# Patient Record
Sex: Male | Born: 1960 | Race: Black or African American | Hispanic: No | Marital: Single | Smoking: Former smoker
Health system: Southern US, Community
[De-identification: ages and names within clinical notes are randomized; demographics above are authoritative.]

## PROBLEM LIST (undated history)

## (undated) DIAGNOSIS — F1010 Alcohol abuse, uncomplicated: Secondary | ICD-10-CM

---

## 2012-09-10 ENCOUNTER — Emergency Department (INDEPENDENT_AMBULATORY_CARE_PROVIDER_SITE_OTHER)
Admission: EM | Admit: 2012-09-10 | Discharge: 2012-09-10 | Disposition: A | Discharge status: Home / Self Care | Payer: Self-pay | Source: Home / Self Care | Attending: Emergency Medicine | Admitting: Emergency Medicine

## 2012-09-10 ENCOUNTER — Encounter (HOSPITAL_COMMUNITY): Payer: Self-pay | Admitting: *Deleted

## 2012-09-10 DIAGNOSIS — R200 Anesthesia of skin: Secondary | ICD-10-CM

## 2012-09-10 DIAGNOSIS — R209 Unspecified disturbances of skin sensation: Secondary | ICD-10-CM

## 2012-09-10 LAB — POCT URINALYSIS DIP (DEVICE)
Protein, ur: NEGATIVE mg/dL
Specific Gravity, Urine: <=1.005 (ref 1.005–1.030)
Urobilinogen, UA: 0.2 mg/dL (ref 0.0–1.0)

## 2012-09-10 LAB — POCT I-STAT, CHEM 8
Creatinine, Ser: 1.5 mg/dL — ABNORMAL HIGH (ref 0.50–1.35)
Glucose, Bld: 97 mg/dL (ref 70–99)
Hemoglobin: 15.3 g/dL (ref 13.0–17.0)
Sodium: 140 mEq/L (ref 135–145)
TCO2: 25 mmol/L (ref 0–100)

## 2012-09-10 MED ORDER — SODIUM CHLORIDE 0.9 % IV SOLN
INTRAVENOUS | Status: DC
  Administered 2012-09-10: 17:00:00 via INTRAVENOUS

## 2012-09-10 NOTE — ED Provider Notes (Signed)
History     CSN: [id number]  Arrival date & time 09/10/12  1613   None     Chief Complaint  Patient presents with  . Numbness    (Consider location/radiation/quality/duration/timing/severity/associated sxs/prior treatment) Patient is a 52 y.o. male presenting with lower extremity pain. The history is provided by the patient. No language interpreter was used.  Foot Pain This is a new problem. Episode onset: 3-4 weeks. The problem occurs constantly. The problem has been gradually worsening. He has tried nothing for the symptoms.   Pt complains of toes being numb on his left foot.   Pt is worried about diabetes.  Pt reports he does not have a doctor.  No medical care.  Pt has a history of alcohol abuse.  Pt complains of urinating frequently. History reviewed. No pertinent past medical history.  History reviewed. No pertinent past surgical history.  Family History  Problem Relation Age of Onset  . Family history unknown: Yes    History  Substance Use Topics  . Smoking status: Never Smoker   . Smokeless tobacco: Not on file  . Alcohol Use: 2.4 oz/week    4 Cans of beer per week     pt drinks daily      Review of Systems  All other systems reviewed and are negative.    Allergies  Review of patient's allergies indicates no known allergies.  Home Medications  No current outpatient prescriptions on file.  BP 110/75  Pulse 92  Temp 97.9 F (36.6 C) (Oral)  Resp 18  SpO2 95%  Physical Exam  Nursing note and vitals reviewed. Constitutional: He appears well-developed and well-nourished.  HENT:  Head: Normocephalic and atraumatic.  Right Ear: External ear normal.  Left Ear: External ear normal.  Nose: Nose normal.  Mouth/Throat: Oropharynx is clear and moist.  Eyes: Conjunctivae normal are normal.  Neck: Normal range of motion.  Cardiovascular: Normal rate and normal heart sounds.   Pulmonary/Chest: Effort normal.  Abdominal: Soft.  Musculoskeletal:   Decreased sensation left toes,  Pt unable to flex and extend foot,  Pt denies any injuries  Neurological: He is alert.  Skin: Skin is warm.  Psychiatric: He has a normal mood and affect.    ED Course  Procedures (including critical care time)  Labs Reviewed  POCT URINALYSIS DIP (DEVICE) - Abnormal; Notable for the following:    Hgb urine dipstick SMALL (*)     All other components within normal limits   No results found.   1. Numbness of foot       MDM  Pt reports he has had back pain in the past.  Pt reports chronic pain pain.   I suspect pt has a herniated disc.  I am concerned because of foot drop.   Pt to ED.   (Pt decided not to go to ED after agreeing to go after discussing symptoms with me,  He did not inform me but refused shuttle and left.          Lonia Skinner [place], [place] 09/10/12 1844  Elson Areas, PA 09/10/12 1845  Lonia Skinner [place], [place] 09/10/12 1918

## 2012-09-10 NOTE — ED Notes (Signed)
Pt reports left toe numbness - "It has been happening for awhile, I am an alcoholic. I think that I could have diabetes" pt speech is slurred, slightly unsteady on feet

## 2012-09-10 NOTE — ED Notes (Signed)
Pt aunt reports that pt refuses to go to the er - advised of it's importance and risks of not going. MD aware

## 2012-09-15 NOTE — ED Provider Notes (Signed)
Medical screening examination/treatment/procedure(s) were performed by non-physician practitioner and as supervising physician I was immediately available for consultation/collaboration.  Kenyetta Wimbish M. MD   Ketrick Matney M Jermiah Soderman, MD 09/15/12 0806 

## 2013-07-28 ENCOUNTER — Inpatient Hospital Stay (HOSPITAL_COMMUNITY)
Admission: AD | Admit: 2013-07-28 | Discharge: 2013-08-01 | DRG: 897 | Disposition: A | Discharge status: Home / Self Care | Payer: No Typology Code available for payment source | Source: Intra-hospital | Attending: Psychiatry | Admitting: Psychiatry

## 2013-07-28 ENCOUNTER — Encounter (HOSPITAL_COMMUNITY): Payer: Self-pay

## 2013-07-28 ENCOUNTER — Encounter (HOSPITAL_COMMUNITY): Payer: Self-pay | Admitting: *Deleted

## 2013-07-28 ENCOUNTER — Emergency Department (HOSPITAL_COMMUNITY)
Admission: EM | Admit: 2013-07-28 | Discharge: 2013-07-28 | Disposition: A | Discharge status: Other Acute Inpatient Hospital | Payer: Self-pay | Attending: Emergency Medicine | Admitting: Emergency Medicine

## 2013-07-28 DIAGNOSIS — F1010 Alcohol abuse, uncomplicated: Secondary | ICD-10-CM

## 2013-07-28 DIAGNOSIS — F10129 Alcohol abuse with intoxication, unspecified: Secondary | ICD-10-CM | POA: Diagnosis present

## 2013-07-28 DIAGNOSIS — F1020 Alcohol dependence, uncomplicated: Principal | ICD-10-CM | POA: Diagnosis present

## 2013-07-28 DIAGNOSIS — R259 Unspecified abnormal involuntary movements: Secondary | ICD-10-CM | POA: Insufficient documentation

## 2013-07-28 DIAGNOSIS — F10239 Alcohol dependence with withdrawal, unspecified: Secondary | ICD-10-CM

## 2013-07-28 HISTORY — DX: Alcohol abuse, uncomplicated: F10.10

## 2013-07-28 LAB — RAPID URINE DRUG SCREEN, HOSP PERFORMED
Barbiturates: NOT DETECTED
Benzodiazepines: NOT DETECTED
Cocaine: NOT DETECTED
Tetrahydrocannabinol: NOT DETECTED

## 2013-07-28 LAB — CBC
MCH: 31.4 pg (ref 26.0–34.0)
MCHC: 34.1 g/dL (ref 30.0–36.0)
Platelets: 257 10*3/uL (ref 150–400)

## 2013-07-28 LAB — COMPREHENSIVE METABOLIC PANEL
ALT: 135 U/L — ABNORMAL HIGH (ref 0–53)
AST: 177 U/L — ABNORMAL HIGH (ref 0–37)
Calcium: 9.8 mg/dL (ref 8.4–10.5)
Sodium: 137 mEq/L (ref 135–145)
Total Protein: 8.5 g/dL — ABNORMAL HIGH (ref 6.0–8.3)

## 2013-07-28 MED ORDER — ONDANSETRON HCL 4 MG PO TABS: 8.0000 mg | ORAL_TABLET | Freq: Three times a day (TID) | ORAL | Status: DC | PRN

## 2013-07-28 MED ORDER — MAGNESIUM HYDROXIDE 400 MG/5ML PO SUSP: 30.0000 mL | Freq: Every day | ORAL | Status: DC | PRN

## 2013-07-28 MED ORDER — CHLORDIAZEPOXIDE HCL 25 MG PO CAPS
25.0000 mg | ORAL_CAPSULE | Freq: Four times a day (QID) | ORAL | Status: AC
  Administered 2013-07-29 – 2013-07-30 (×6): 25 mg via ORAL
  Filled 2013-07-28 (×6): qty 1

## 2013-07-28 MED ORDER — CHLORDIAZEPOXIDE HCL 25 MG PO CAPS
25.0000 mg | ORAL_CAPSULE | ORAL | Status: AC
  Administered 2013-07-31 – 2013-08-01 (×2): 25 mg via ORAL
  Filled 2013-07-28 (×2): qty 1

## 2013-07-28 MED ORDER — ZOLPIDEM TARTRATE 5 MG PO TABS: 5.0000 mg | ORAL_TABLET | Freq: Every evening | ORAL | Status: DC | PRN

## 2013-07-28 MED ORDER — CHLORDIAZEPOXIDE HCL 25 MG PO CAPS
25.0000 mg | ORAL_CAPSULE | Freq: Every day | ORAL | Status: AC
  Administered 2013-08-01: 25 mg via ORAL

## 2013-07-28 MED ORDER — THIAMINE HCL 100 MG/ML IJ SOLN: 100.0000 mg | Freq: Every day | INTRAMUSCULAR | Status: DC

## 2013-07-28 MED ORDER — HYDROXYZINE HCL 25 MG PO TABS: 25.0000 mg | ORAL_TABLET | Freq: Four times a day (QID) | ORAL | Status: AC | PRN

## 2013-07-28 MED ORDER — ACETAMINOPHEN 325 MG PO TABS: 650.0000 mg | ORAL_TABLET | ORAL | Status: DC | PRN

## 2013-07-28 MED ORDER — ADULT MULTIVITAMIN W/MINERALS CH
1.0000 | ORAL_TABLET | Freq: Every day | ORAL | Status: DC
  Administered 2013-07-29 – 2013-08-01 (×4): 1 via ORAL
  Filled 2013-07-28 (×6): qty 1

## 2013-07-28 MED ORDER — ALUM & MAG HYDROXIDE-SIMETH 200-200-20 MG/5ML PO SUSP: 30.0000 mL | ORAL | Status: DC | PRN

## 2013-07-28 MED ORDER — TRAZODONE HCL 50 MG PO TABS
50.0000 mg | ORAL_TABLET | Freq: Every evening | ORAL | Status: DC | PRN
  Filled 2013-07-28: qty 28
  Filled 2013-07-28: qty 1
  Filled 2013-07-28: qty 28
  Filled 2013-07-28 (×8): qty 1
  Filled 2013-07-28: qty 28
  Filled 2013-07-28 (×2): qty 1
  Filled 2013-07-28: qty 28
  Filled 2013-07-28: qty 1

## 2013-07-28 MED ORDER — ACETAMINOPHEN 325 MG PO TABS: 650.0000 mg | ORAL_TABLET | Freq: Four times a day (QID) | ORAL | Status: DC | PRN

## 2013-07-28 MED ORDER — LOPERAMIDE HCL 2 MG PO CAPS: 2.0000 mg | ORAL_CAPSULE | ORAL | Status: AC | PRN

## 2013-07-28 MED ORDER — VITAMIN B-1 100 MG PO TABS
100.0000 mg | ORAL_TABLET | Freq: Every day | ORAL | Status: DC
  Administered 2013-07-29 – 2013-08-01 (×4): 100 mg via ORAL
  Filled 2013-07-28 (×6): qty 1

## 2013-07-28 MED ORDER — LORAZEPAM 1 MG PO TABS
0.0000 mg | ORAL_TABLET | Freq: Four times a day (QID) | ORAL | Status: DC
  Administered 2013-07-28: 1 mg via ORAL
  Filled 2013-07-28: qty 1

## 2013-07-28 MED ORDER — CHLORDIAZEPOXIDE HCL 25 MG PO CAPS
25.0000 mg | ORAL_CAPSULE | Freq: Three times a day (TID) | ORAL | Status: AC
  Administered 2013-07-30 – 2013-07-31 (×3): 25 mg via ORAL
  Filled 2013-07-28 (×3): qty 1

## 2013-07-28 MED ORDER — LORAZEPAM 1 MG PO TABS: 0.0000 mg | ORAL_TABLET | Freq: Two times a day (BID) | ORAL | Status: DC

## 2013-07-28 MED ORDER — CHLORDIAZEPOXIDE HCL 25 MG PO CAPS: 25.0000 mg | ORAL_CAPSULE | Freq: Four times a day (QID) | ORAL | Status: AC | PRN

## 2013-07-28 MED ORDER — THIAMINE HCL 100 MG/ML IJ SOLN: 100.0000 mg | Freq: Once | INTRAMUSCULAR | Status: DC

## 2013-07-28 MED ORDER — ONDANSETRON 4 MG PO TBDP: 4.0000 mg | ORAL_TABLET | Freq: Four times a day (QID) | ORAL | Status: AC | PRN

## 2013-07-28 MED ORDER — VITAMIN B-1 100 MG PO TABS
100.0000 mg | ORAL_TABLET | Freq: Every day | ORAL | Status: DC
  Administered 2013-07-28: 100 mg via ORAL
  Filled 2013-07-28: qty 1

## 2013-07-28 MED ORDER — IBUPROFEN 200 MG PO TABS: 600.0000 mg | ORAL_TABLET | Freq: Three times a day (TID) | ORAL | Status: DC | PRN

## 2013-07-28 MED ORDER — CHLORDIAZEPOXIDE HCL 25 MG PO CAPS
25.0000 mg | ORAL_CAPSULE | Freq: Once | ORAL | Status: AC
  Administered 2013-07-28: 25 mg via ORAL
  Filled 2013-07-28: qty 1

## 2013-07-28 MED ORDER — NICOTINE 21 MG/24HR TD PT24: 21.0000 mg | MEDICATED_PATCH | Freq: Every day | TRANSDERMAL | Status: DC

## 2013-07-28 NOTE — Progress Notes (Signed)
[facility] provided patient with a [facility] Orange Card application.  °

## 2013-07-28 NOTE — Tx Team (Signed)
Initial Interdisciplinary Treatment Plan  PATIENT STRENGTHS: (choose at least two) Ability for insight Average or above average intelligence Capable of independent living General fund of knowledge Motivation for treatment/growth Physical Health Supportive family/friends  PATIENT STRESSORS: Financial difficulties Substance abuse   PROBLEM LIST: Problem List/Patient Goals Date to be addressed Date deferred Reason deferred Estimated date of resolution  ETOH abuse 07/29/13                                                      DISCHARGE CRITERIA:  Improved stabilization in mood, thinking, and/or behavior Motivation to continue treatment in a less acute level of care Need for constant or close observation no longer present Verbal commitment to aftercare and medication compliance Withdrawal symptoms are absent or subacute and managed without 24-hour nursing intervention  PRELIMINARY DISCHARGE PLAN: Participate in family therapy Return to previous living arrangement  PATIENT/FAMIILY INVOLVEMENT: This treatment plan has been presented to and reviewed with the patient, Gary Simmons.  The patient and family have been given the opportunity to ask questions and make suggestions.  Hoover Browns 07/28/2013, 9:27 PM

## 2013-07-28 NOTE — ED Provider Notes (Signed)
CSN: [id number]     Arrival date & time 07/28/13  1014 History     First MD Initiated Contact with Patient 07/28/13 1029     Chief Complaint  Patient presents with  . Medical Clearance   (Consider location/radiation/quality/duration/timing/severity/associated sxs/prior Treatment) HPI Gary Simmons is a 53 y.o. male who presents to ED with complaint of wanting alcohol detox. Pt states pt has been drinking heavily for "years." States drinks all day long. States used to drink vodka, but now mainly beer. States has tried to stop drinking on his own, but gets "shaky." States has to drink again to avoid the shakes. Denies SI or HI. Denies any medical problems or complaints. Last drink last night.   Past Medical History  Diagnosis Date  . Alcohol abuse    History reviewed. No pertinent past surgical history. No family history on file. History  Substance Use Topics  . Smoking status: Never Smoker   . Smokeless tobacco: Not on file  . Alcohol Use: 2.4 oz/week    4 Cans of beer per week     Comment: pt drinks daily    Review of Systems  Constitutional: Negative for fever and chills.  HENT: Negative for neck pain.   Respiratory: Negative.   Cardiovascular: Negative.   Gastrointestinal: Negative.   Genitourinary: Negative for flank pain.  Musculoskeletal: Negative.   Allergic/Immunologic: Negative for immunocompromised state.  Neurological: Positive for tremors. Negative for dizziness, light-headedness and headaches.  Psychiatric/Behavioral: Negative.     Allergies  Review of patient's allergies indicates no known allergies.  Home Medications  No current outpatient prescriptions on file. BP 140/88  Pulse 62  Temp(Src) 98.8 F (37.1 C) (Oral)  Resp 20  SpO2 100% Physical Exam  Nursing note and vitals reviewed. Constitutional: He is oriented to person, place, and time. He appears well-developed and well-nourished. No distress.  HENT:  Head: Normocephalic.  Eyes: Conjunctivae  are normal.  Neck: Neck supple.  Cardiovascular: Normal rate, regular rhythm and normal heart sounds.   Pulmonary/Chest: Effort normal and breath sounds normal. No respiratory distress. He has no wheezes. He has no rales.  Abdominal: Soft. Bowel sounds are normal. He exhibits no distension. There is no tenderness. There is no rebound.  Musculoskeletal: He exhibits no edema.  Neurological: He is alert and oriented to person, place, and time.  Mild intentional tremmor  Skin: Skin is warm and dry.  Psychiatric: He has a normal mood and affect. His behavior is normal.    ED Course   Procedures (including critical care time)  Labs Reviewed  COMPREHENSIVE METABOLIC PANEL - Abnormal; Notable for the following:    Total Protein 8.5 (*)    AST 177 (*)    ALT 135 (*)    All other components within normal limits  ETHANOL - Abnormal; Notable for the following:    Alcohol, Ethyl (B) 61 (*)    All other components within normal limits  CBC  URINE RAPID DRUG SCREEN (HOSP PERFORMED)   11:26 AM Pt seen and examined. Requesting alcohol detox. Never been before. Currently mildly tremulous, however, VS normal, he is in no distress. Will start on CIWA, holding ordered placed. Will move to the back.    Filed Vitals:   07/28/13 1110  BP: 140/88  Pulse: 62  Temp: 98.8 F (37.1 C)  Resp: 20    No results found.  1. Alcohol abuse     MDM  Pt with hx of alcohol abuse. Here for detox. No prior  rehab or detox. No SI or HI. Pt has mild tremmor, probably early withdrawal. Pt place don CIWA, holding orders. ACT consulted.   Filed Vitals:   07/28/13 1952  BP: 128/72  Pulse: 71  Temp: 98.7 F (37.1 C)  Resp: 20     Sapphire Tygart A Shigeru Lampert, PA-C 07/28/13 2021

## 2013-07-28 NOTE — ED Notes (Signed)
No acute distress noted.

## 2013-07-28 NOTE — BHH Counselor (Signed)
Pt accepted to bed 507-1. Support paperwork signed and faxed to [facility]. Originals placed in pt's chart.  Evette Cristal, [place] Assessment Counselor

## 2013-07-28 NOTE — Progress Notes (Signed)
Patient discussed and case reviewed. Patient needs inpatient detox.

## 2013-07-28 NOTE — ED Provider Notes (Signed)
  He has been [facility] the [facility]   Medical screening examination/treatment/procedure(s) were performed by non-physician practitioner and as supervising physician I was immediately available for consultation/collaboration.  Flint Melter, MD 07/28/13 2016

## 2013-07-28 NOTE — Progress Notes (Signed)
Patient reviewed with Thurman Coyer, RN, [facility] and Dr. Nelly Rout of [facility].  Patient is accepted to [facility] for medical detox by Dr. Nelly Rout pending bed assignment. Patients meets criteria for inpatient treatment.    Darryll Capers, RN, BSN, AD 07/28/2013 3:59 PM

## 2013-07-28 NOTE — ED Notes (Addendum)
Belongings in [place] [address], [employer] and other belongings with Security

## 2013-07-28 NOTE — BH Assessment (Signed)
Assessment Note   Gary Simmons is an 53 y.o. male with history of alcohol dependency. He presents to [facility] seeking detox. Sts that he has tried to stop drinking on his own, however; the withdrawal symptoms are unbearable. He visible shows signs of tremors. He denies history of seizures and/or DT's. Patient started drinking at age 63. Patient started drinking heavy at age 47. He reports over the course of 10 yrs he would drink daily. He started out drinking vodka and then started drinking malt liquor, which made him sick. Patient now drinking (5)40 oz beers daily. His last drink was yesterday and he drank his normal daily amount. Patient denies drug use. He has never participated in a inpatient or outpatient program. Says, "Once I get relief from my withdrawal symptoms I will be ok and I hope that I will never drink again".   Pt denies SI, HI, and AVH's.   Patient reviewed with Thurman Coyer, RN, [facility] and Dr. Nelly Rout of [facility].  Patient is accepted to [facility] for medical detox by Dr. Nelly Rout pending bed assignment. Patients meets criteria for inpatient treatment  Axis I: Alcohol Dependence  Axis II: Deferred Axis III:  Past Medical History  Diagnosis Date  . Alcohol abuse    Axis IV: other psychosocial or environmental problems, problems related to social environment, problems with access to health care services and problems with primary support group Axis V: 31-40 impairment in reality testing  Past Medical History:  Past Medical History  Diagnosis Date  . Alcohol abuse     History reviewed. No pertinent past surgical history.  Family History: No family history on file.  Social History:  reports that he has never smoked. He does not have any smokeless tobacco history on file. He reports that he drinks about 2.4 ounces of alcohol per week. He reports that he does not use illicit drugs.  Additional Social History:  Alcohol / Drug Use Pain Medications: SEE MAR Prescriptions: SEE  MAR Over the Counter: SEE MAR History of alcohol / drug use?: Yes Substance #1 Name of Substance 1: Alcohol  1 - Age of First Use: 53 y/o 1 - Amount (size/oz): "I went from drinking Vodka, Malt Liqour, and now beer); pt reports drinking (5) 40 oz beers 1 - Frequency: daily for the past 10 yrs  1 - Duration: on-going for the past 10 yrs  1 - Last Use / Amount: 07/27/2013  CIWA: CIWA-Ar BP: 140/88 mmHg Pulse Rate: 62 Nausea and Vomiting: no nausea and no vomiting Tactile Disturbances: none Tremor: moderate, with patient's arms extended Auditory Disturbances: not present Paroxysmal Sweats: no sweat visible Visual Disturbances: not present Anxiety: two Headache, Fullness in Head: none present Agitation: normal activity Orientation and Clouding of Sensorium: oriented and can do serial additions CIWA-Ar Total: 6 COWS:    Allergies: No Known Allergies  Home Medications:  (Not in a hospital admission)  OB/GYN Status:  No LMP for male patient.  General Assessment Data Location of Assessment: [facility] Living Arrangements: Other (Comment);Alone (pt lives alone) Can pt return to current living arrangement?: Yes Admission Status: Voluntary Is patient capable of signing voluntary admission?: Yes Transfer from: Acute Hospital Referral Source: Self/Family/Friend     Risk to self Suicidal Ideation: No Suicidal Intent: No Is patient at risk for suicide?: No Suicidal Plan?: No Access to Means: No What has been your use of drugs/alcohol within the last 12 months?:  (n/a) Previous Attempts/Gestures: No How many times?:  (0) Other Self  Harm Risks:  (n/a) Triggers for Past Attempts: Other (Comment) (no previous attempts and/or gestures) Intentional Self Injurious Behavior: None Family Suicide History: No Recent stressful life event(s): Other (Comment) ("My only stressors would be these withdrawal symptoms") Persecutory voices/beliefs?: No Depression: No Depression Symptoms:  (no  depressive symptoms noted) Substance abuse history and/or treatment for substance abuse?: No  Risk to Others Homicidal Ideation: No Thoughts of Harm to Others: No Current Homicidal Intent: No Current Homicidal Plan: No Access to Homicidal Means: No Identified Victim:  (n/a) History of harm to others?: No Assessment of Violence: None Noted Violent Behavior Description:  (patient calm, cooperative; pleasant) Does patient have access to weapons?: No Criminal Charges Pending?: No Does patient have a court date: No  Psychosis Hallucinations: None noted Delusions: None noted  Mental Status Report Appear/Hygiene: Disheveled Eye Contact: Good Motor Activity: Freedom of movement Speech: Logical/coherent Level of Consciousness: Alert Mood: Other (Comment) (approrpriate) Affect: Appropriate to circumstance Anxiety Level: None Thought Processes: Coherent Judgement: Unimpaired Orientation: Person;Place;Time;Situation Obsessive Compulsive Thoughts/Behaviors: None  Cognitive Functioning Concentration: Decreased Memory: Recent Intact;Remote Intact IQ: Average Insight: Good Impulse Control: Good Appetite: Poor ("I eat....but I have no appetite") Weight Loss:  (none reported) Weight Gain:  (none reported) Sleep: No Change Total Hours of Sleep:  (6-8 hours per night) Vegetative Symptoms: None  ADLScreening [facility] Assessment Services) Patient's cognitive ability adequate to safely complete daily activities?: No Patient able to express need for assistance with ADLs?: No Independently performs ADLs?: Yes (appropriate for developmental age)  Abuse/Neglect [facility]) Physical Abuse: Denies Verbal Abuse: Denies Sexual Abuse: Denies  Prior Inpatient Therapy Prior Inpatient Therapy: No Prior Therapy Dates:  (n/a) Prior Therapy Facilty/Provider(s):  (n/a) Reason for Treatment:  (n/a)  Prior Outpatient Therapy Prior Outpatient Therapy: No Prior Therapy Dates:  (n/a) Prior Therapy  Facilty/Provider(s):  (n/a) Reason for Treatment:  (n/a)  ADL Screening (condition at time of admission) Patient's cognitive ability adequate to safely complete daily activities?: No Is the patient deaf or have difficulty hearing?: No Does the patient have difficulty seeing, even when wearing glasses/contacts?: No Does the patient have difficulty concentrating, remembering, or making decisions?: No Patient able to express need for assistance with ADLs?: No Does the patient have difficulty dressing or bathing?: No Independently performs ADLs?: Yes (appropriate for developmental age) Does the patient have difficulty walking or climbing stairs?: No Weakness of Legs: None Weakness of Arms/Hands: None  Home Assistive Devices/Equipment Home Assistive Devices/Equipment: None    Abuse/Neglect Assessment (Assessment to be complete while patient is alone) Physical Abuse: Denies Verbal Abuse: Denies Sexual Abuse: Denies Values / Beliefs Cultural Requests During Hospitalization: None Spiritual Requests During Hospitalization: None   Advance Directives (For Healthcare) Advance Directive: Patient does not have advance directive Nutrition Screen- [facility] Adult/[facility] Patient's home diet: Regular  Additional Information 1:1 In Past 12 Months?: No CIRT Risk: No Elopement Risk: No Does patient have medical clearance?: Yes     Disposition:  Disposition Initial Assessment Completed for this Encounter: Yes Disposition of Patient: Inpatient treatment program;Referred to;Other dispositions [facility]) Type of inpatient treatment program: Adult Other disposition(s): Information only;Other (Comment)   Patient reviewed with Thurman Coyer, RN, [facility] and Dr. Nelly Rout of [facility].  Patient is accepted to [facility] for medical detox by Dr. Nelly Rout pending bed assignment. Patients meets criteria for inpatient treatment   On Site Evaluation by:   Reviewed with Physician:     Melynda Ripple [facility] [place] 07/28/2013 4:27  PM

## 2013-07-28 NOTE — Progress Notes (Signed)
Patient ID: Gary Simmons, male   DOB: [birth year], 53 y.o.   MRN: [id number] Pt admitted to [facility] voluntarily for ETOH detox. This is patient's first admission.  Pt reported he drinks 5-6 40 oz/day.  Pt reported that he has been trying to quit on his own for approximately 6 months but due to his withdrawal symptoms he returns to drinking.  Pt reported that one of his motivators is the fact that his behavior is affecting his relationship with his girlfriend.  Pt denies legal concerns at this time but admits he has had a DUI in the past.  Pt denies SI, HI and AVH.  Fifteen minute checks in progress. Pt oriented to unit. Pt safe on unit.

## 2013-07-28 NOTE — ED Notes (Signed)
Pt requesting help to quit drinking, states has shaking if he doesn't drink. Pt states last drink last pm, states drink till he passes out

## 2013-07-29 DIAGNOSIS — F1010 Alcohol abuse, uncomplicated: Secondary | ICD-10-CM | POA: Diagnosis present

## 2013-07-29 DIAGNOSIS — F10129 Alcohol abuse with intoxication, unspecified: Secondary | ICD-10-CM | POA: Diagnosis present

## 2013-07-29 DIAGNOSIS — F1994 Other psychoactive substance use, unspecified with psychoactive substance-induced mood disorder: Secondary | ICD-10-CM

## 2013-07-29 NOTE — Tx Team (Signed)
Interdisciplinary Treatment Plan Update (Adult)  Date: 07/29/2013  Time Reviewed:  9:45 AM  Progress in Treatment: Attending groups: Yes Participating in groups:  Yes Taking medication as prescribed:  Yes Tolerating medication:  Yes Family/Significant othe contact made: N/A Patient understands diagnosis:  Yes Discussing patient identified problems/goals with staff:  Yes Medical problems stabilized or resolved:  Yes Denies suicidal/homicidal ideation: Yes Issues/concerns per patient self-inventory:  Yes Other:  New problem(s) identified: N/A  Discharge Plan or Barriers: Pt will follow up at [facility] medication management and therapy   Reason for Continuation of Hospitalization: Anxiety Depression Medication Stabilization Alcohol Detox  Comments: N/A  Estimated length of stay: 3-5 days  For review of initial/current patient goals, please see plan of care.  Attendees: Patient:     Family:     Physician:  Dr. Javier Glazier 07/29/2013 10:56 AM   Nursing:   Burnetta Sabin, RN 07/29/2013 10:56 AM   Clinical Social Worker:  Reyes Ivan, LCSWA 07/29/2013 10:56 AM   Other: Harold Barban, RN  07/29/2013 10:56 AM   Other:  Frankey Shown, MA care coordination 07/29/2013 10:56 AM   Other:   Other:     Other:    Other:    Other:    Other:    Other:    Other:     Scribe for Treatment Team:   Carmina Miller, 07/29/2013 10:56 AM
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
## 2013-07-29 NOTE — Progress Notes (Signed)
Adult Psychoeducational Group Note  Date:  07/29/2013 Time:  3:49 PM  Group Topic/Focus:  Personal Choices and Values:   The focus of this group is to help patients assess and explore the importance of values in their lives, how their values affect their decisions, how they express their values and what opposes their expression.  Participation Level:  Active  Participation Quality:  Appropriate, Attentive and Sharing  Affect:  Appropriate and Tearful  Cognitive:  Alert and Appropriate  Insight: Appropriate and Good  Engagement in Group:  Engaged, Improving and Supportive  Modes of Intervention:  Discussion, Education and Support  Additional Comments:  Pt identified "being married" as a high personal value for him. "I was suppose to get married seven years ago but my alcohol has gotten in the way. My girlfriend doesn't want to be married to an alcoholic". Pt had tears running down his check as he shared.   Reynolds Bowl 07/29/2013, 3:49 PM

## 2013-07-29 NOTE — [facility] Suicide Risk Assessment (Signed)
Suicide Risk Assessment  Admission Assessment     Nursing information obtained from:  Patient Demographic factors:  Male;Low socioeconomic status;Living alone Current Mental Status:  NA Loss Factors:  Financial problems / change in socioeconomic status Historical Factors:  NA Risk Reduction Factors:  Sense of responsibility to family  CLINICAL FACTORS:   Alcohol/Substance Abuse/Dependencies  COGNITIVE FEATURES THAT CONTRIBUTE TO RISK:  Polarized thinking    SUICIDE RISK:   Minimal: No identifiable suicidal ideation.  Patients presenting with no risk factors but with morbid ruminations; may be classified as minimal risk based on the severity of the depressive symptoms  PLAN OF CARE: Admitted voluntarily, emergently from [facility] for alcohol detox treatment after failed to stop drinking on his own.   I certify that inpatient services furnished can reasonably be expected to improve the patient's condition.   Deon Ivey,JANARDHAHA R. 07/29/2013, 10:20 AM

## 2013-07-29 NOTE — Progress Notes (Signed)
D: Patient pleasant and cooperative with staff and peers. Patient's affect is appropriate to circumstance, but mood is depressed. He reported on the self inventory sheet that he slept well, appetite/ability to pay attention are good and energy level is normal. He's participating in groups, attending meals and interacting with peers in the milieu.  A: Support and encouragement provided to patient. Scheduled medications administered per MD orders. Maintain Q15 minute checks for safety.  R: Patient receptive. Denies SI/HI/AVH. Patient remains safe.

## 2013-07-29 NOTE — ED Provider Notes (Signed)
Medical screening examination/treatment/procedure(s) were conducted as a shared visit with non-physician practitioner(s) or resident  and myself.  I personally evaluated the patient during the encounter and agree with the findings and plan unless otherwise indicated.  Possible early withdrawal.  ACT consulted for assistance.   Enid Skeens, MD 07/29/13 [phone]

## 2013-07-29 NOTE — [facility] Suicide Risk Assessment (Signed)
[facility] INPATIENT: Family/Significant Other Suicide Prevention Education  Suicide Prevention Education:  Education Completed; No one has been identified by the patient as the family member/significant other with whom the patient will be residing, and identified as the person(s) who will aid the patient in the event of a mental health crisis (suicidal ideations/suicide attempt). With written consent from the patient, the family member/significant other has been provided the following suicide prevention education, prior to the and/or following the discharge of the patient.  The suicide prevention education provided includes the following:  Suicide risk factors  Suicide prevention and interventions  National Suicide Hotline telephone number  [facility] assessment telephone number  [facility] Emergency Assistance 911  [facility] and/or Residential Mobile Crisis Unit telephone number Request made of family/significant other to:  Remove weapons (e.g., guns, rifles, knives), all items previously/currently identified as safety concern.  Remove drugs/medications (over-the-counter, prescriptions, illicit drugs), all items previously/currently identified as a safety concern. The family member/significant other verbalizes understanding of the suicide prevention education information provided. The family member/significant other agrees to remove the items of safety concern listed above. Pt did not c/o SI at admission, nor have they endorsed SI during their stay here. SPE not required.  Reyes Ivan, LCSWA 07/29/2013  10:33 AM
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
## 2013-07-29 NOTE — [facility] Group Notes (Signed)
[facility] LCSW Aftercare Discharge Planning Group Note   07/29/2013 8:45 AM  Participation Quality:  Alert and Appropriate   Mood/Affect:  Appropriate and Calm  Depression Rating:  0  Anxiety Rating:  0  Thoughts of Suicide:  Pt denies SI/HI  Will you contract for safety?   Yes  Current AVH:  Pt denies  Plan for Discharge/Comments:  Pt attended discharge planning group and actively participated in group.  CSW provided pt with today's workbook.  Pt states that he is here for alcohol detox.  Pt states that he doesn't have depression or anxiety.  Pt states that he will return home in [place].  Pt denies having any outpatient providers.  CSW will assess for appropriate referrals.  No further needs voiced by pt at this time.    Transportation Means: Pt reports access to transportation  Supports: No supports mentioned at this time  Gary Simmons, LCSWA 07/29/2013 9:48 AM

## 2013-07-29 NOTE — Progress Notes (Signed)
Patient ID: Gary Simmons, male   DOB: May 29, 1960, 53 y.o.   MRN: [id number] D: pt. In dayroom most of the evening watching TV. Pt. Reports "here for alcohol detox, I tried to do it cold [place] on my own, kept getting the shakes, couldn't do it" "A cousin of mine told me I need to come here and get help before I go into seizures or get sick" A: Writer introduced self to client and encouraged he continue with detox. And follow through with AA for support. Writer reviewed meds. Staff will monitor q5min for safety. Staff encouraged group. R: Pt. Is safe on the unit. Pt. Attended group.

## 2013-07-30 NOTE — Progress Notes (Signed)
Recreation Therapy Notes  Date: 07.31.2014 Time: 3:00pm Location: [address] Dayroom  Group Topic: Leisure Education  Goal Area(s) Addresses:  Patient will verbalize activity of interest by end of group session. Patient will verbalize the ability to use positive leisure/recreation as a coping mechanism.  Behavioral Response: Engaged, Appropriate, Sharing   Intervention: Game  Activity: Letters of Leisure. Game was played in two rounds. Round 1: Patients were asked to select at least two cards from LRT, using these cards patients were asked to state a leisure/recreation activity that starts with the letters they selected, following that they were asked to identify a positive emotion they experience when participating in that activity. Round 2: Patients were asked to select one card from LRT using this card patients were asked to state an emotion to correspond with their letter and an activity that incites that emotion.   Education:  Leisure Education, Pharmacologist, Discharge Planning  Education Outcome: Additionally education needed.  Clinical Observations/Feedback: Patient actively participated in group activity. Patient successfully stated positive leisure activities for letters selected as well as emotions. Patient shared he enjoys cooking with his girlfriend and it brings them closer. Patient shared that his current social network is filled with people who encourage him to drink. Patient spoke about going to meetings to get away from these individuals. LRT explored the idea of building a social network out of people he meets at meetings in an effort to maintain his sobriety. Patient receptive to this idea. Patient spoke about his experience with detox and stated he never wanted to feel that way again. Patient expressed confidence in his ability to remain sober.   Marykay Lex Dnyla Antonetti, LRT/CTRS  Jearl Klinefelter 07/30/2013 4:36 PM

## 2013-07-30 NOTE — Progress Notes (Signed)
Pt attended Karaoke group and actively participated.

## 2013-07-30 NOTE — Progress Notes (Signed)
[facility] MD Progress Note  07/30/2013 1:55 PM Gary Simmons  MRN:  [id number] Subjective:  Patient has no complaint today and he is tolerating well his treatment and medication for alcohol detox treatment. He has no hand tremors, nausea, or sweating. He has stable vitals.  Diagnosis:  Axis I: Alcohol Abuse and Substance Induced Mood Disorder  ADL's:  Intact  Sleep: Fair  Appetite:  Fair  Suicidal Ideation:  denied Homicidal Ideation:  denied AEB (as evidenced by):  Psychiatric Specialty Exam: ROS  Blood pressure 109/69, pulse 66, temperature 98.1 F (36.7 C), temperature source Oral, resp. rate 20, height 6' 3.79" (1.925 m), weight 86.637 kg (191 lb).Body mass index is 23.38 kg/(m^2).  General Appearance: Disheveled and Guarded  Patent attorney::  Fair  Speech:  Clear and Coherent  Volume:  Decreased  Mood:  Anxious and Depressed  Affect:  Constricted and Depressed  Thought Process:  Intact  Orientation:  Full (Time, Place, and Person)  Thought Content:  NA  Suicidal Thoughts:  No  Homicidal Thoughts:  No  Memory:  Immediate;   Fair  Judgement:  Intact  Insight:  Present  Psychomotor Activity:  Psychomotor Retardation  Concentration:  Fair  Recall:  Fair  Akathisia:  NA  Handed:  Right  AIMS (if indicated):     Assets:  Communication Skills Desire for Improvement Physical Health Social Support  Sleep:  Number of Hours: 6.75   Current Medications: Current Facility-Administered Medications  Medication Dose Route Frequency Provider Last Rate Last Dose  . acetaminophen (TYLENOL) tablet 650 mg  650 mg Oral Q6H PRN Nelly Rout, MD      . alum & mag hydroxide-simeth (MAALOX/MYLANTA) 200-200-20 MG/5ML suspension 30 mL  30 mL Oral Q4H PRN Nelly Rout, MD      . chlordiazePOXIDE (LIBRIUM) capsule 25 mg  25 mg Oral Q6H PRN Nelly Rout, MD      . chlordiazePOXIDE (LIBRIUM) capsule 25 mg  25 mg Oral TID Nelly Rout, MD       Followed by  . [START ON 07/31/2013] chlordiazePOXIDE  (LIBRIUM) capsule 25 mg  25 mg Oral BH-qamhs Nelly Rout, MD       Followed by  . [START ON 08/02/2013] chlordiazePOXIDE (LIBRIUM) capsule 25 mg  25 mg Oral Daily Nelly Rout, MD      . hydrOXYzine (ATARAX/VISTARIL) tablet 25 mg  25 mg Oral Q6H PRN Nelly Rout, MD      . loperamide (IMODIUM) capsule 2-4 mg  2-4 mg Oral PRN Nelly Rout, MD      . magnesium hydroxide (MILK OF MAGNESIA) suspension 30 mL  30 mL Oral Daily PRN Nelly Rout, MD      . multivitamin with minerals tablet 1 tablet  1 tablet Oral Daily Nelly Rout, MD   1 tablet at 07/30/13 0800  . ondansetron (ZOFRAN-ODT) disintegrating tablet 4 mg  4 mg Oral Q6H PRN Nelly Rout, MD      . thiamine (B-1) injection 100 mg  100 mg Intramuscular Once Nelly Rout, MD      . thiamine (VITAMIN B-1) tablet 100 mg  100 mg Oral Daily Nelly Rout, MD   100 mg at 07/30/13 0800  . traZODone (DESYREL) tablet 50 mg  50 mg Oral QHS,MR X 1 Nelly Rout, MD        Lab Results: No results found for this or any previous visit (from the past 48 hour(s)).  Physical Findings: AIMS: Facial and Oral Movements Muscles of Facial Expression: None, normal Lips  and Perioral Area: None, normal Jaw: None, normal Tongue: None, normal,Extremity Movements Upper (arms, wrists, hands, fingers): None, normal Lower (legs, knees, ankles, toes): None, normal, Trunk Movements Neck, shoulders, hips: None, normal, Overall Severity Severity of abnormal movements (highest score from questions above): None, normal Incapacitation due to abnormal movements: None, normal Patient's awareness of abnormal movements (rate only patient's report): No Awareness, Dental Status Current problems with teeth and/or dentures?: No Does patient usually wear dentures?: No  CIWA:  CIWA-Ar Total: 1 COWS:     Treatment Plan Summary: Daily contact with patient to assess and evaluate symptoms and progress in treatment Medication management  Plan: Treatment  Plan/Recommendations:  1. Continue crisis management and stabilization. 2. Medication management to reduce current symptoms to base line and improve the patient's overall level of functioning. Librium protocol 3. Treat health problems as indicated. 4. Develop treatment plan to decrease risk of relapse upon discharge and to reduce the need for readmission. 5. Psycho-social education regarding relapse prevention and self care. 6. Health care follow up as needed for medical problems. 7. Restart home medications where appropriate. 8. Disposition plans in progress  Medical Decision Making Problem Points:  Established problem, worsening (2), Review of last therapy session (1) and Review of psycho-social stressors (1) Data Points:  Review or order clinical lab tests (1) Review of medication regiment & side effects (2) Review of new medications or change in dosage (2)  I certify that inpatient services furnished can reasonably be expected to improve the patient's condition.   Nehemiah Settle., MD 07/30/2013, 1:55 PM

## 2013-07-30 NOTE — Progress Notes (Signed)
Adult Psychoeducational Group Note  Date:  07/30/2013 Time:  11:00AM Group Topic/Focus:  Self Esteem Action Plan:   The focus of this group is to help patients create a plan to continue to build self-esteem after discharge.  Participation Level:  Did Not Attend   Additional Comments: Pt. Didn't attend group.   Bing Plume D 07/30/2013, 1:00 PM

## 2013-07-30 NOTE — Progress Notes (Signed)
Patient ID: Gary Simmons, male   DOB: 1960/08/25, 53 y.o.   MRN: [id number] D: pt. In dayroom watching TV, reports "no shakes, look at me" Pt. Says he feels good and thanks staff for all they have done. Pt. Reports some anxiety "I'm getting calls saying man I'm proud of you, and I'm worried about what's going to happen when I leave. "I don't want to drink anymore" "I'm tire of that, through with that". Pt. Reports he already contacted AA and plans to attend meetings not far from his house. Pt. Reports fiancee is his biggest support system. A: Writer encouraged pt to continue with to follow up report system and determination to avoid alcohol and those that use. Staff will monitor q45min for safety. Writer encouraged group.  Writer also encouraged pt. To document how helpful program has been on the survey. R: pt. Is safe on the unit. Pt. Attended karaoke.

## 2013-07-30 NOTE — Progress Notes (Signed)
The focus of this group is to educate the patient on the purpose and policies of crisis stabilization and provide a format to answer questions about their admission.  The group details unit policies and expectations of patients while admitted.  Patient attended 0900 nurse education group.  Patient actively participated, appropriate affect, alert, appropriate insight, engagement appropriate.  Patient will work on goals for discharge.

## 2013-07-30 NOTE — Progress Notes (Signed)
D: Patient's affect is appropriate to circumstance and mood is anxious. He reported on the self inventory sheet that his sleep is fair, appetite and ability to pay attention are good and energy level is normal. Patient did not rate his depression or feelings of hopelessness. He reported that a change he plans to make to better care for himself is to stay sober.  A: Support and encouragement provided to patient. Administered scheduled medications per ordering MD. Monitor Q15 minute checks for safety.  R: Patient receptive. Denies SI/HI/AVH. Patient remains safe on the unit.

## 2013-07-30 NOTE — [facility] Group Notes (Signed)
[facility] LCSW Group Therapy  07/30/2013 4:58 PM  Type of Therapy:  Group Therapy  Participation Level:  Active  Participation Quality:  Attentive  Affect:  Appropriate  Cognitive:  Alert  Insight:  Improving  Engagement in Therapy:  Engaged  Modes of Intervention:  Confrontation, Discussion, Education, Exploration, Socialization and Support  Summary of Progress/Problems:  Finding Balance in Life. Today's group focused on defining balance in one's own words, identifying things that can knock one off balance, and exploring healthy ways to maintain balance in life. Group members were asked to provide an example of a time when they felt off balance, describe how they handled that situation,and process healthier ways to regain balance in the future. Group members were asked to share the most important tool for maintaining balance that they learned while at [facility] and how they plan to apply this method after discharge. Gary Simmons identified "alcohol and loss of relationships" as his primary reasons associated with a loss of life balance. Gary Simmons explained that he does not crave alcohol, but rather craves the lifestyle of "going out and partying." He shows some progress in the group setting AEB his ability to open up to others and provide support/encouragement to other group members. Gary Simmons shows improving insight due to his ability to acknowledge that he must stay away from environments that have alcohol and must take better care of his body by eating three meals a day. He plans to get another sponsor and improve his support network in order to assist him in recovery.    Smart, [name] 07/30/2013, 4:58 PM

## 2013-07-31 NOTE — Progress Notes (Signed)
Adult Psychoeducational Group Note  Date:  07/31/2013 Time:  6:38 PM  Group Topic/Focus:  Relapse Prevention Planning:   The focus of this group is to define relapse and discuss the need for planning to combat relapse.  Participation Level:  Minimal  Participation Quality:  Appropriate  Affect:  Appropriate  Cognitive:  Appropriate  Insight: Appropriate  Engagement in Group:  Limited  Modes of Intervention:  Discussion  Additional Comments:  Pt was resistant to share goals and triggers. Pt is progressing. Pt also programs on 300 hall.  Tora Perches N 07/31/2013, 6:38 PM

## 2013-07-31 NOTE — [facility] Group Notes (Signed)
[facility] LCSW Group Therapy  07/31/2013 2:26 PM  Type of Therapy:  Group Therapy  Participation Level:  Active  Participation Quality:  Appropriate  Affect:  Appropriate  Cognitive:  Alert  Insight:  Improving  Engagement in Therapy:  Engaged and Monopolizing  Modes of Intervention:  Discussion, Education, Exploration, Socialization and Support  Summary of Progress/Problems: Feelings around Relapse. Group members discussed the meaning of relapse and shared personal stories of relapse, how it affected them and others, and how they perceived themselves during this time. Group members were encouraged to identify triggers, warning signs and coping skills used when facing the possibility of relapse. Social supports were discussed and explored in detail. Post Acute Withdrawal Syndrome (handout provided) was introduced and examined. Pt's were encouraged to ask questions, talk about key points associated with PAWS, and process this information in terms of relapse prevention. Gary Simmons stated that he felt "scared and disappointed" after learning about PAWS. He was offered support and encouragement by CSW and other group members. Gary Simmons shows some progress in the group setting and improved insight AEB his ability to identify ways to cope with PAWS on "the bad days"--"I plan to show my wife this information and make sure that I'm distracting myself with tv or whatever. I also plan to make sure I continue to eat 3 meals a day and get enough rest. If my wife bothers me when I am irritable, I will just go for a walk or something."    Smart, Gary Simmons 07/31/2013, 2:26 PM

## 2013-07-31 NOTE — Progress Notes (Signed)
Patient ID: Gary Simmons, male   DOB: February 14, 1960, 53 y.o.   MRN: [id number] [facility] MD Progress Note  07/31/2013 12:02 PM Gary Simmons  MRN:  [id number]  Subjective:  Patient has no complaint today and he is tolerating well his treatment and medication for alcohol detox treatment. He has no hand tremors, nausea, or sweating. Patient has been becoming to complete his detox treatment and also wishes to go to the long term rehabilitation services. Patient has no suicidal homicidal ideation intention or plan. Patient has no evidence of psychotic symptoms.  Diagnosis:  Axis I: Alcohol Abuse and Substance Induced Mood Disorder  ADL's:  Intact  Sleep: Fair  Appetite:  Fair  Suicidal Ideation:  denied Homicidal Ideation:  denied AEB (as evidenced by):  Psychiatric Specialty Exam: ROS  Blood pressure 112/77, pulse 77, temperature 97.7 F (36.5 C), temperature source Oral, resp. rate 20, height 6' 3.79" (1.925 m), weight 86.637 kg (191 lb).Body mass index is 23.38 kg/(m^2).  General Appearance: Disheveled and Guarded  Patent attorney::  Fair  Speech:  Clear and Coherent  Volume:  Decreased  Mood:  Anxious and Depressed  Affect:  Constricted and Depressed  Thought Process:  Intact  Orientation:  Full (Time, Place, and Person)  Thought Content:  NA  Suicidal Thoughts:  No  Homicidal Thoughts:  No  Memory:  Immediate;   Fair  Judgement:  Intact  Insight:  Present  Psychomotor Activity:  Psychomotor Retardation  Concentration:  Fair  Recall:  Fair  Akathisia:  NA  Handed:  Right  AIMS (if indicated):     Assets:  Communication Skills Desire for Improvement Physical Health Social Support  Sleep:  Number of Hours: 6.5   Current Medications: Current Facility-Administered Medications  Medication Dose Route Frequency Provider Last Rate Last Dose  . acetaminophen (TYLENOL) tablet 650 mg  650 mg Oral Q6H PRN Nelly Rout, MD      . alum & mag hydroxide-simeth (MAALOX/MYLANTA) 200-200-20 MG/5ML  suspension 30 mL  30 mL Oral Q4H PRN Nelly Rout, MD      . chlordiazePOXIDE (LIBRIUM) capsule 25 mg  25 mg Oral Q6H PRN Nelly Rout, MD      . chlordiazePOXIDE (LIBRIUM) capsule 25 mg  25 mg Oral TID Nelly Rout, MD   25 mg at 07/31/13 0745   Followed by  . chlordiazePOXIDE (LIBRIUM) capsule 25 mg  25 mg Oral BH-qamhs Nelly Rout, MD       Followed by  . [START ON 08/02/2013] chlordiazePOXIDE (LIBRIUM) capsule 25 mg  25 mg Oral Daily Nelly Rout, MD      . hydrOXYzine (ATARAX/VISTARIL) tablet 25 mg  25 mg Oral Q6H PRN Nelly Rout, MD      . loperamide (IMODIUM) capsule 2-4 mg  2-4 mg Oral PRN Nelly Rout, MD      . magnesium hydroxide (MILK OF MAGNESIA) suspension 30 mL  30 mL Oral Daily PRN Nelly Rout, MD      . multivitamin with minerals tablet 1 tablet  1 tablet Oral Daily Nelly Rout, MD   1 tablet at 07/31/13 0745  . ondansetron (ZOFRAN-ODT) disintegrating tablet 4 mg  4 mg Oral Q6H PRN Nelly Rout, MD      . thiamine (B-1) injection 100 mg  100 mg Intramuscular Once Nelly Rout, MD      . thiamine (VITAMIN B-1) tablet 100 mg  100 mg Oral Daily Nelly Rout, MD   100 mg at 07/31/13 0744  . traZODone (DESYREL) tablet 50 mg  50 mg Oral QHS,MR X 1 Nelly Rout, MD        Lab Results: No results found for this or any previous visit (from the past 48 hour(s)).  Physical Findings: AIMS: Facial and Oral Movements Muscles of Facial Expression: None, normal Lips and Perioral Area: None, normal Jaw: None, normal Tongue: None, normal,Extremity Movements Upper (arms, wrists, hands, fingers): None, normal Lower (legs, knees, ankles, toes): None, normal, Trunk Movements Neck, shoulders, hips: None, normal, Overall Severity Severity of abnormal movements (highest score from questions above): None, normal Incapacitation due to abnormal movements: None, normal Patient's awareness of abnormal movements (rate only patient's report): No Awareness, Dental Status Current  problems with teeth and/or dentures?: No Does patient usually wear dentures?: No  CIWA:  CIWA-Ar Total: 0 COWS:     Treatment Plan Summary: Daily contact with patient to assess and evaluate symptoms and progress in treatment Medication management  Plan: Treatment Plan/Recommendations:  1. Continue crisis management and stabilization. 2. Medication management to reduce current symptoms to base line and improve the patient's overall level of functioning. Librium protocol, trazodone for sleep. 3. Treat health problems as indicated. 4. Develop treatment plan to decrease risk of relapse upon discharge and to reduce the need for readmission. 5. Psycho-social education regarding relapse prevention and self care. 6. Health care follow up as needed for medical problems. 7. Restart home medications where appropriate. 8. Disposition plans in progress  Medical Decision Making Problem Points:  Established problem, worsening (2), Review of last therapy session (1) and Review of psycho-social stressors (1) Data Points:  Review or order clinical lab tests (1) Review of medication regiment & side effects (2) Review of new medications or change in dosage (2)  I certify that inpatient services furnished can reasonably be expected to improve the patient's condition.   Nehemiah Settle., MD 07/31/2013, 12:02 PM

## 2013-07-31 NOTE — Progress Notes (Signed)
D: Patient appropriate and cooperative with staff and peers. Patient's affect/mood is anxious. He reported on the self inventory sheet that his sleep is fair, appetite and ability to pay attention are good and energy level is high. Patient did voice concern earlier about which day he would discharge; he was under the impression that it was today and because of his feeling of not being ready to go, he became very anxious. Per treatment team this morning, patient will be discharged on tomorrow.  A: Support and encouragement provided to patient. Scheduled medications administered per MD orders. Maintain Q15 minute checks for safety.  R: Patient receptive. Denies SI/HI/AVH. Patient remains safe.

## 2013-07-31 NOTE — Progress Notes (Signed)
(  Discharge Saturday) [facility] Adult Case Management Discharge Plan :  Will you be returning to the same living situation after discharge: Yes,  returning home At discharge, do you have transportation home?:Yes,  access to transportation Do you have the ability to pay for your medications:Yes,  access to meds  Release of information consent forms completed and in the chart;  Patient's signature needed at discharge.  Patient to Follow up at: Follow-up Information   Follow up with [facility] On 08/04/2013. (Walk in clinic Monday - Friday 8 am - 3 pm for hospital discharge appointment)    Contact information:   [address], [place] [address] Phone: [phone] Fax: [phone]      Patient denies SI/HI:   Yes,  denies SI/HI    Safety Planning and Suicide Prevention discussed:  Yes,  discussed with pt but N/A due to not being admitted with SI.  CSW provided pt with AA information and meetings.    Carmina Miller 07/31/2013, 11:57 AM

## 2013-07-31 NOTE — [facility] Group Notes (Signed)
[facility] LCSW Aftercare Discharge Planning Group Note   07/31/2013  8:45 AM  Participation Quality:  Alert and Appropriate   Mood/Affect:  Appropriate  Depression Rating:  0  Anxiety Rating:  0  Thoughts of Suicide:  Pt denies SI/HI  Will you contract for safety?   Yes  Current AVH:  Pt denies  Plan for Discharge/Comments:  Pt attended discharge planning group and actively participated in group.  CSW provided pt with today's workbook.  Pt states that he feels ready to d/c after completing the detox protocol.  Pt will return home in [place]. Pt will follow up at [facility] for therapy and AA meetings.  No further needs voiced by pt at this time.    Transportation Means: Pt reports access to transportation  Supports: No supports mentioned at this time  Reyes Ivan, LCSWA 07/31/2013 9:42 AM

## 2013-07-31 NOTE — Tx Team (Signed)
Interdisciplinary Treatment Plan Update (Adult)  Date: 07/31/2013  Time Reviewed:  9:45 AM  Progress in Treatment: Attending groups: Yes Participating in groups:  Yes Taking medication as prescribed:  Yes Tolerating medication:  Yes Family/Significant othe contact made: N/A Patient understands diagnosis:  Yes Discussing patient identified problems/goals with staff:  Yes Medical problems stabilized or resolved:  Yes Denies suicidal/homicidal ideation: Yes Issues/concerns per patient self-inventory:  Yes Other:  New problem(s) identified: N/A  Discharge Plan or Barriers: Pt will follow up with [facility] and AA meetings  Reason for Continuation of Hospitalization: Anxiety Depression Medication Stabilization  Comments: N/A  Estimated length of stay: 1 day, d/c tomorrow  For review of initial/current patient goals, please see plan of care.  Attendees: Patient:     Family:     Physician:  Dr. Javier Glazier 07/31/2013 11:02 AM   Nursing:   Alease Frame, RN 07/31/2013 11:02 AM   Clinical Social Worker:  Reyes Ivan, LCSWA 07/31/2013 11:02 AM   Other: Chinita Greenland, RN 07/31/2013 11:02 AM   Other:  Frankey Shown, MA care coordination 07/31/2013 11:02 AM   Other:  Onnie Boer, case manager 07/31/2013 11:02 AM   Other:  Seabron Spates, RN 07/31/2013 11:02 AM  Other:    Other:    Other:    Other:    Other:    Other:     Scribe for Treatment Team:   Carmina Miller, 07/31/2013 11:02 AM

## 2013-08-01 MED ORDER — TRAZODONE HCL 50 MG PO TABS: 50.0000 mg | ORAL_TABLET | Freq: Every evening | ORAL | Status: DC | PRN

## 2013-08-01 NOTE — Progress Notes (Signed)
Patient ID: Gary Simmons, male   DOB: 05/22/1960, 53 y.o.   MRN: [id number] D)  Has been out and about on the hall this evening, laughing,  flirtatious with select peers,  and has made some inappropriate suggestions with staff, hinting at going out after discharge, etc.  Then stated women have been all over him since he came to [facility].  Has been redirected several times.  Reminded to focus on why he is here.  States he feels great, denies w/d sx.   A)  Will continue to monitor for safety, redirection, continue POC R)  Safety maintained.

## 2013-08-01 NOTE — Progress Notes (Signed)
D) Pt being discharged to home via the city bus system. Pt was happy that he was able to get back home. Pt's affect and mood are appropriate. Denies SI and HI.  A) Pt. Provided with positive feedback and reassurance. Is aware of his follow up plans and his medications. All belongings returned to Pt. R) Denies SI and HI. Given support

## 2013-08-01 NOTE — [facility] Suicide Risk Assessment (Signed)
Suicide Risk Assessment  Discharge Assessment     Demographic Factors:  Male, Adolescent or young adult, Low socioeconomic status and Living alone  Mental Status Per Nursing Assessment::   On Admission:  NA  Current Mental Status by Physician: Mental Status Examination: Patient appeared as per his stated age, casually dressed, and fairly groomed, and maintaining good eye contact. Patient has good mood and his affect was appropriate. He has normal rate, rhythm, and volume of speech. His thought process is linear and goal directed. Patient has denied suicidal, homicidal ideations, intentions or plans. Patient has no evidence of auditory or visual hallucinations, delusions, and paranoia. Patient has fair insight judgment and impulse control.  Loss Factors: Decrease in vocational status, Legal issues and Financial problems/change in socioeconomic status  Historical Factors: Family history of mental illness or substance abuse and Impulsivity  Risk Reduction Factors:   Sense of responsibility to family, Religious beliefs about death, Living with another person, especially a relative, Positive social support, Positive therapeutic relationship and Positive coping skills or problem solving skills  Continued Clinical Symptoms:  Alcohol/Substance Abuse/Dependencies  Cognitive Features That Contribute To Risk:  Polarized thinking    Suicide Risk:  Minimal: No identifiable suicidal ideation.  Patients presenting with no risk factors but with morbid ruminations; may be classified as minimal risk based on the severity of the depressive symptoms  Discharge Diagnoses:   AXIS I:  Alcohol Dependence and withdrawal AXIS II:  Deferred AXIS III:   Past Medical History  Diagnosis Date  . Alcohol abuse    AXIS IV:  economic problems, occupational problems, other psychosocial or environmental problems, problems related to social environment and problems with primary support group AXIS V:  61-70 mild  symptoms  Plan Of Care/Follow-up recommendations:  Activity:  as tolerated Diet:  regular  Is patient on multiple antipsychotic therapies at discharge:  No   Has Patient had three or more failed trials of antipsychotic monotherapy by history:  No  Recommended Plan for Multiple Antipsychotic Therapies: Not applicable  Nehemiah Settle., MD 08/01/2013, 9:58 AM

## 2013-08-01 NOTE — [facility] Group Notes (Signed)
[facility] Group Notes:  (Clinical Social Work)  08/01/2013     10-11AM  Summary of Progress/Problems:   The main focus of today's process group was for the patient to identify ways in which they have in the past sabotaged their own recovery. Motivational Interviewing was utilized to ask the group members what they get out of their substance use, and what reasons they may have for wanting to change.  The Stages of Change were explained using a handout, and patients identified where they currently are with regard to stages of change.  The patient expressed that he thought he was the cool guy with the party house, dancing and being happy.  He has been finding out that he smells bad, does not take care of his hygiene, neglects eating for alcohol, lost the job his best friend got him, has no pride.  He cried while talking about phone calls he has received in the hospital stating that people in his life are proud of him.  He stated he is in Action Stage because he is in the hospital, and CSW challenged this, stating he came here against his will.   He decided he is in Preparation Stage.  He is going home to the party house, and has to make decisions about how to handle his drinking roommate and friends who stop by.  Suggestions were given.  Type of Therapy:  Group Therapy - Process   Participation Level:  Active  Participation Quality:  Attentive, Sharing and Supportive  Affect:  Appropriate and Tearful  Cognitive:  Alert and Appropriate  Insight:  Developing/Improving  Engagement in Therapy:  Engaged  Modes of Intervention:  Education, Support and Processing, Motivational Interviewing  [employer], LCSW 08/01/2013, 12:12 PM

## 2013-08-01 NOTE — Discharge Summary (Signed)
Physician Discharge Summary Note  Patient:  Gary Simmons is an 53 y.o., male MRN:  [id number] DOB:  Mar 22, 1960 Patient phone:  [phone] (home)  Patient address:   [address] [place] [address],   Date of Admission:  07/28/2013 Date of Discharge: 08/01/2013  Reason for Admission:  Alcohol detox/dependency  Discharge Diagnoses: Principal Problem:   Alcohol dependence Active Problems:   Alcohol abuse with intoxication  Review of Systems  Constitutional: Negative.   HENT: Negative.   Eyes: Negative.   Respiratory: Negative.   Cardiovascular: Negative.   Gastrointestinal: Negative.   Genitourinary: Negative.   Musculoskeletal: Negative.   Skin: Negative.   Neurological: Negative.   Endo/Heme/Allergies: Negative.   Psychiatric/Behavioral: Positive for substance abuse.   Axis Diagnosis:   AXIS I:  Alcohol Abuse and Substance Induced Mood Disorder AXIS II:  Deferred AXIS III:   Past Medical History  Diagnosis Date  . Alcohol abuse    AXIS IV:  economic problems, other psychosocial or environmental problems, problems related to social environment and problems with primary support group AXIS V:  61-70 mild symptoms  Level of Care:  OP  Hospital Course:  On admission:  53 y.o. AA male admitted voluntarily, emergently from [facility] for alcohol detox treatment. Patient presents with alcohol intoxication, and history of alcohol dependence. Reportedly he has tried to stop drinking on his own, however; the withdrawal symptoms are unbearable. He has visible sighs of tremors on arrival. He denies history of seizures and/or DT's. Patient started drinking at age 77. Patient started drinking heavy at age 35. He reports over the course of 10 yrs he has been drinking regularly. His drink of choice is drinking vodka and then started drinking malt liquor, which made him sick. Patient now drinking (5) 40 oz beers daily. His last drink was yesterday and he drank his normal daily amount. Patient  denies drug of abuse. He has never participated in a inpatient or outpatient program. Patient is motivated to quit drinking and stated that "I will never drink again".   During hospitalization:  Medications managed:  Librium protocol implemented successfully.  Trazodone 50 mg for sleep ordered at bedtime.  Patient participated in groups and developed/strengthened coping skills to maintain sobriety.  Patient denied suicidal/homicidal ideations and auditory/visual hallucinations, follow-up appointments encouraged to attend, outside support groups (AA) encouraged and information given, Rx given with 14 day supply.  Gary Simmons is mentally and physically stable for discharge.  Consults:  None  Significant Diagnostic Studies:  labs: Completed and reviewed, stable  Discharge Vitals:   Blood pressure 113/72, pulse 58, temperature 97.7 F (36.5 C), temperature source Oral, resp. rate 16, height 6' 3.79" (1.925 m), weight 86.637 kg (191 lb). Body mass index is 23.38 kg/(m^2). Lab Results:   No results found for this or any previous visit (from the past 72 hour(s)).  Physical Findings: AIMS: Facial and Oral Movements Muscles of Facial Expression: None, normal Lips and Perioral Area: None, normal Jaw: None, normal Tongue: None, normal,Extremity Movements Upper (arms, wrists, hands, fingers): None, normal Lower (legs, knees, ankles, toes): None, normal, Trunk Movements Neck, shoulders, hips: None, normal, Overall Severity Severity of abnormal movements (highest score from questions above): None, normal Incapacitation due to abnormal movements: None, normal Patient's awareness of abnormal movements (rate only patient's report): No Awareness, Dental Status Current problems with teeth and/or dentures?: No Does patient usually wear dentures?: No  CIWA:  CIWA-Ar Total: 0 COWS:     Psychiatric Specialty Exam: See Psychiatric Specialty Exam and  Suicide Risk Assessment completed by Attending Physician prior to  discharge.  Discharge destination:  Home  Is patient on multiple antipsychotic therapies at discharge:  No   Has Patient had three or more failed trials of antipsychotic monotherapy by history:  No  Recommended Plan for Multiple Antipsychotic Therapies:  N/A  Discharge Orders   Future Orders Complete By Expires     Activity as tolerated - No restrictions  As directed     Diet - low sodium heart healthy  As directed         Medication List       Indication   traZODone 50 MG tablet  Commonly known as:  DESYREL  Take 1 tablet (50 mg total) by mouth at bedtime and may repeat dose one time if needed.   Indication:  Trouble Sleeping           Follow-up Information   Follow up with [facility] On 08/04/2013. (Walk in clinic Monday - Friday 8 am - 3 pm for hospital discharge appointment)    Contact information:   [address], [place] [address] Phone: [phone] Fax: [phone]      Follow-up recommendations:  Activity:  As tolerated Diet:  Low-sodium heart healthy diet  Comments:  Patient will continue his care at [facility].  Total Discharge Time:  Greater than 30 minutes.  SignedNanine Means, PMH-NP 08/01/2013, 8:42 AM  Patient was seen and evaluated for suicide risk assessment, formulated discharge plans and case discussed with physician extender.Reviewed the information documented and agree with the treatment plan.  Gary Simmons,JANARDHAHA R. 08/04/2013 12:39 PM

## 2013-08-05 NOTE — Progress Notes (Signed)
Patient Discharge Instructions:  After Visit Summary (AVS):   Faxed to:  08/05/13 Discharge Summary Note:   Faxed to:  08/05/13 Psychiatric Admission Assessment Note:   Faxed to:  08/05/13 Suicide Risk Assessment - Discharge Assessment:   Faxed to:  08/05/13 Faxed/Sent to the Next Level Care provider:  08/05/13 Faxed to [facility] @ [phone]  Jerelene Redden, 08/05/2013, 3:27 PM

## 2017-09-17 ENCOUNTER — Emergency Department (HOSPITAL_COMMUNITY): Payer: Managed Care, Other (non HMO)

## 2017-09-17 ENCOUNTER — Encounter (HOSPITAL_COMMUNITY): Payer: Self-pay | Admitting: Emergency Medicine

## 2017-09-17 ENCOUNTER — Emergency Department (HOSPITAL_COMMUNITY)
Admission: EM | Admit: 2017-09-17 | Discharge: 2017-09-17 | Disposition: A | Discharge status: Home / Self Care | Payer: Managed Care, Other (non HMO) | Attending: Emergency Medicine | Admitting: Emergency Medicine

## 2017-09-17 DIAGNOSIS — S2231XA Fracture of one rib, right side, initial encounter for closed fracture: Secondary | ICD-10-CM

## 2017-09-17 DIAGNOSIS — Y929 Unspecified place or not applicable: Secondary | ICD-10-CM | POA: Insufficient documentation

## 2017-09-17 DIAGNOSIS — Y939 Activity, unspecified: Secondary | ICD-10-CM | POA: Diagnosis not present

## 2017-09-17 DIAGNOSIS — Y999 Unspecified external cause status: Secondary | ICD-10-CM | POA: Insufficient documentation

## 2017-09-17 DIAGNOSIS — W01190A Fall on same level from slipping, tripping and stumbling with subsequent striking against furniture, initial encounter: Secondary | ICD-10-CM | POA: Diagnosis not present

## 2017-09-17 MED ORDER — NAPROXEN 500 MG PO TABS: 500.0000 mg | ORAL_TABLET | Freq: Two times a day (BID) | ORAL | 0 refills | Status: DC

## 2017-09-17 MED ORDER — KETOROLAC TROMETHAMINE 30 MG/ML IJ SOLN
30.0000 mg | Freq: Once | INTRAMUSCULAR | Status: DC
  Filled 2017-09-17: qty 1

## 2017-09-17 NOTE — Discharge Instructions (Signed)
Please read and follow all provided instructions.  Your diagnoses today include:  1. Closed fracture of one rib of right side, initial encounter     Tests performed today include: Vital signs. See below for your results today. Chest xray Medications prescribed:  Take naproxen as prescribed  Home care instructions:  Follow any educational materials contained in this packet. Use incentive spirometry 10x/hour to prevent pneumonia.   Follow-up instructions: Please follow-up with your ortho in 1 week for further evaluation of symptoms and treatment   Return instructions:  Please return to the Emergency Department if you do not get better, if you get worse, or new symptoms OR  - Fever (temperature greater than 101.38F)  - Bleeding that does not stop with holding pressure to the area    -Severe pain (please note that you may be more sore the day after your accident)  - Chest Pain  - Difficulty breathing  - Severe nausea or vomiting  - Inability to tolerate food and liquids  - Passing out  - Skin becoming red around your wounds  - Change in mental status (confusion or lethargy)  - New numbness or weakness    Please return if you have any other emergent concerns.  Additional Information:  Your vital signs today were: BP (!) 163/91 (BP Location: Left Arm)    Pulse 65    Temp 99.1 F (37.3 C) (Oral)    Resp 18    SpO2 100%  If your blood pressure (BP) was elevated above 135/85 this visit, please have this repeated by your doctor within one month.
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
## 2017-09-17 NOTE — ED Triage Notes (Signed)
Patient first admits he is a heavy alcohol drinker on the weekends. Pt states Saturday night he fell and hit his right side of ribs.

## 2018-11-07 IMAGING — CR DG RIBS W/ CHEST 3+V*R*
4 series · 4 of 4 positions shown · non-contrast
Comparison: None.

CLINICAL DATA: Right lower rib pain since a fall 3-4 days ago.
Initial encounter.

EXAM:
RIGHT RIBS AND CHEST - 3+ VIEW

[w chest pa (1 of 3)]
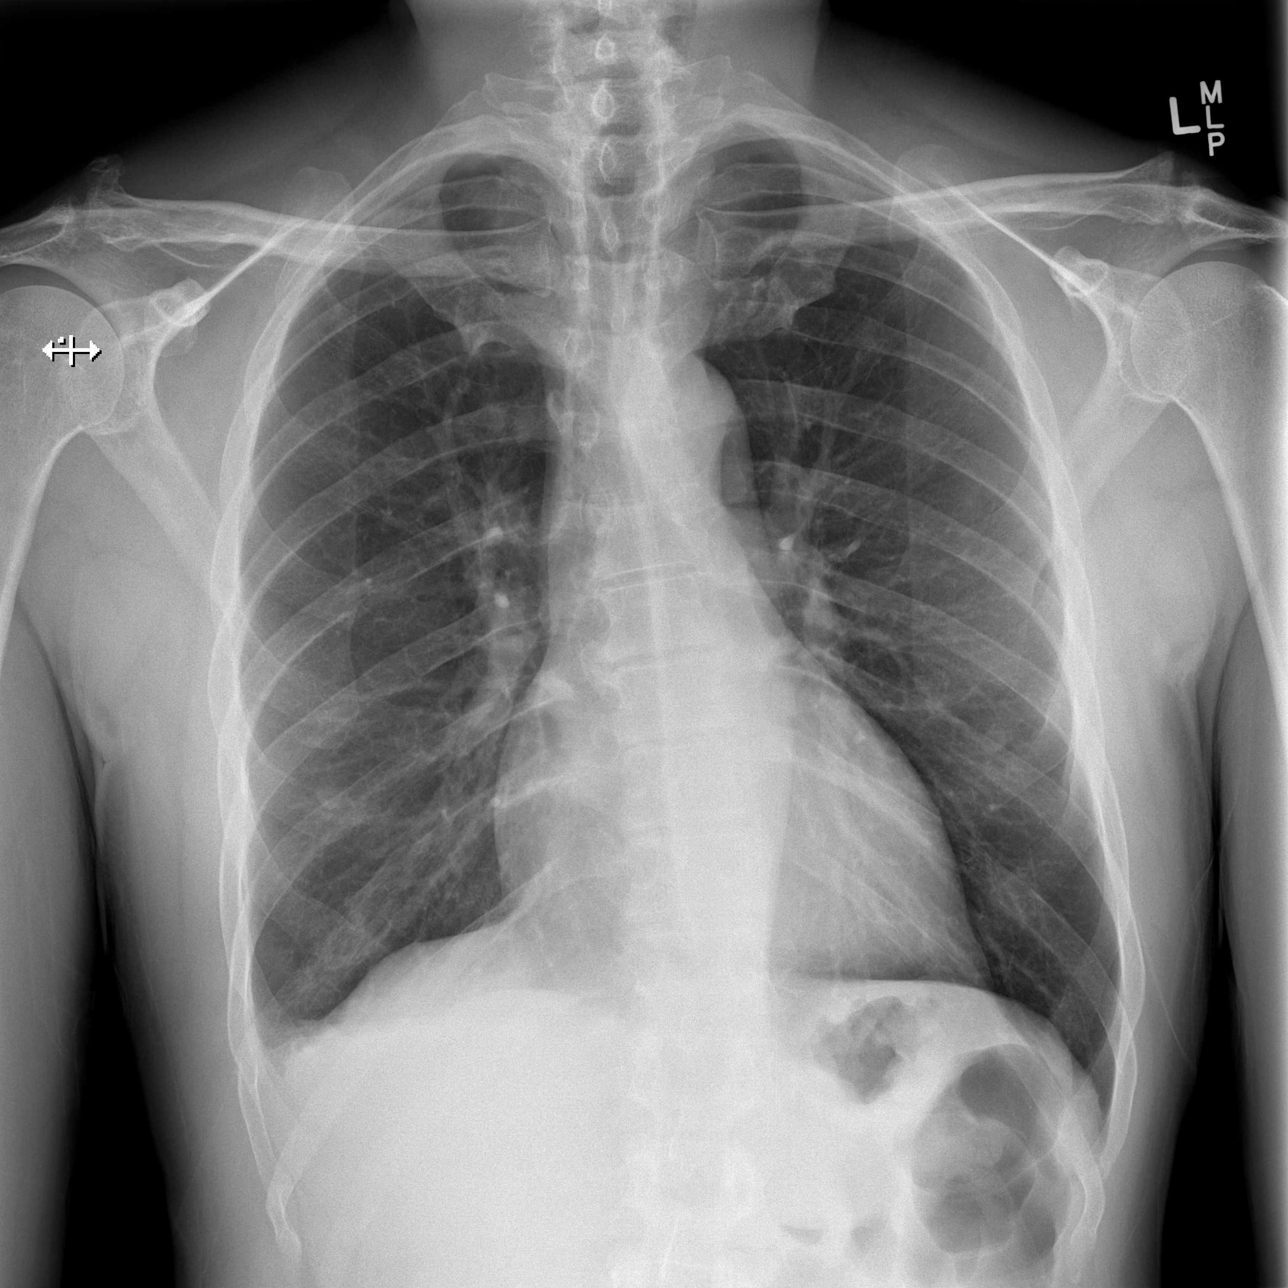

[w chest pa (2 of 3)]
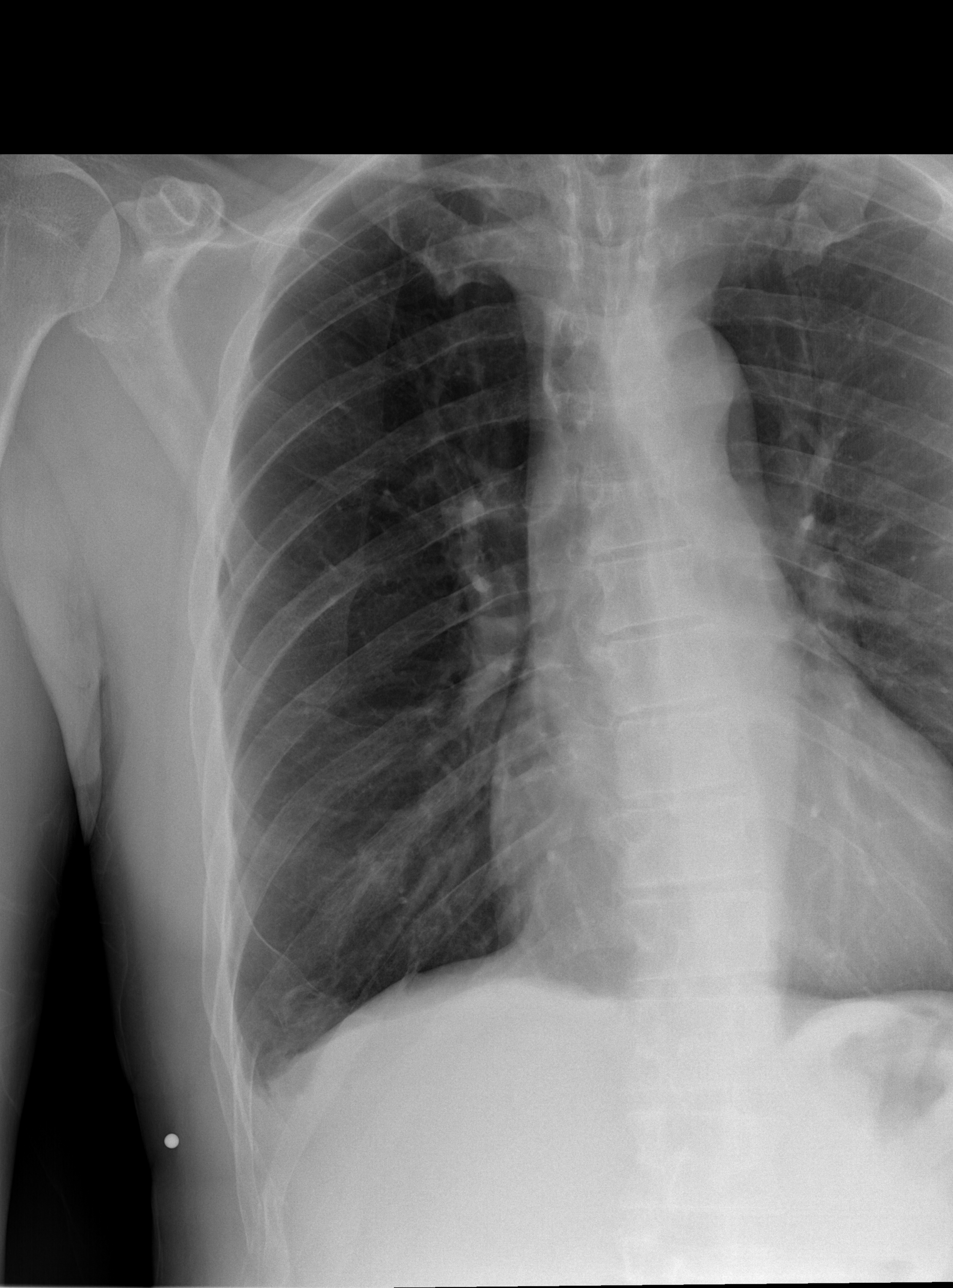

[w chest pa (3 of 3)]
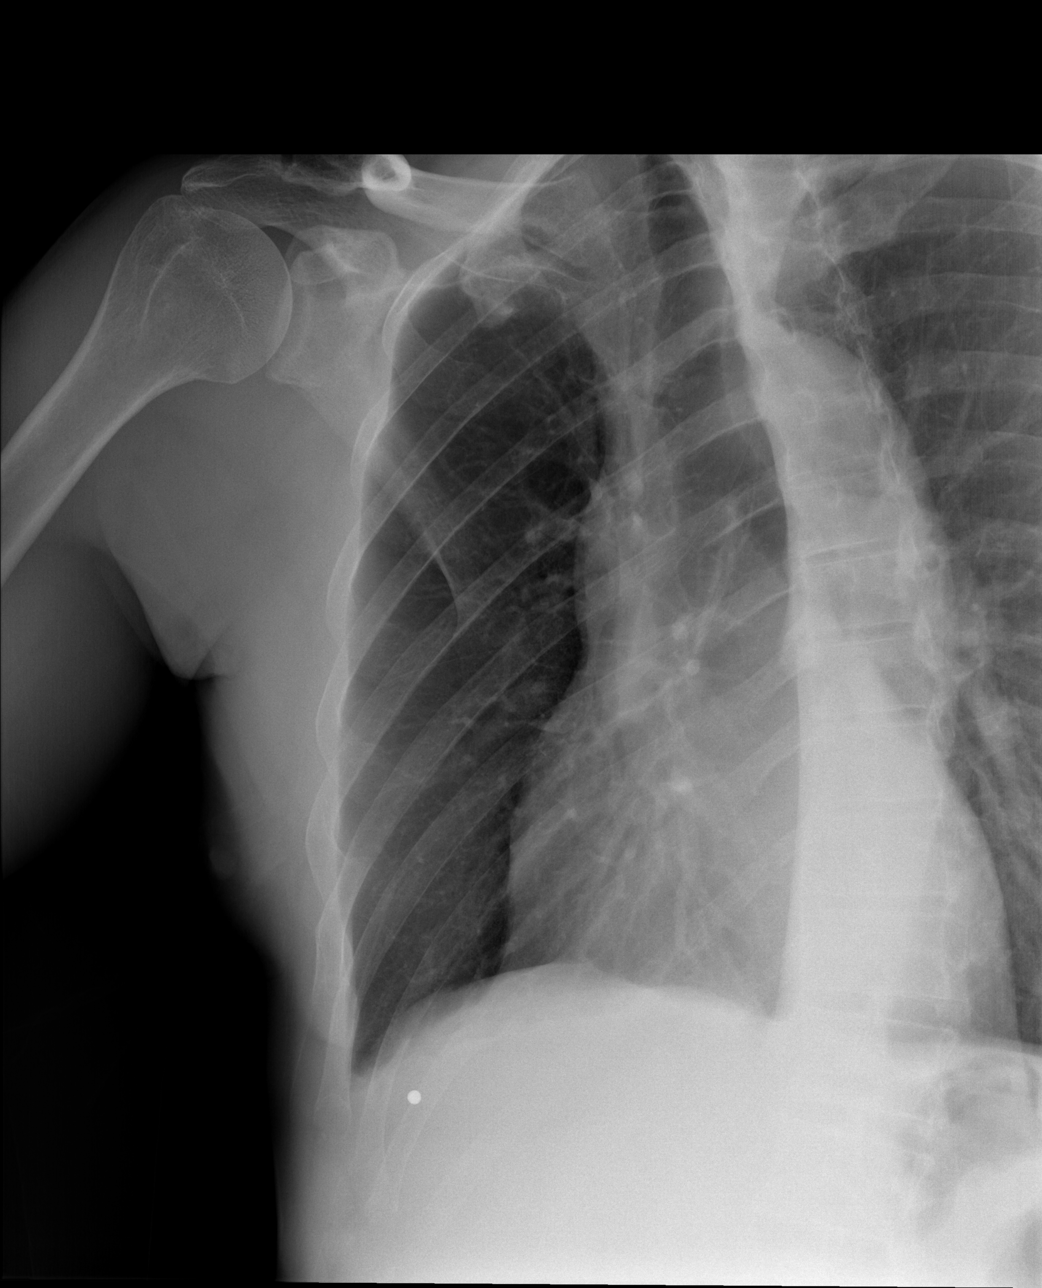

[w ribs obl right]
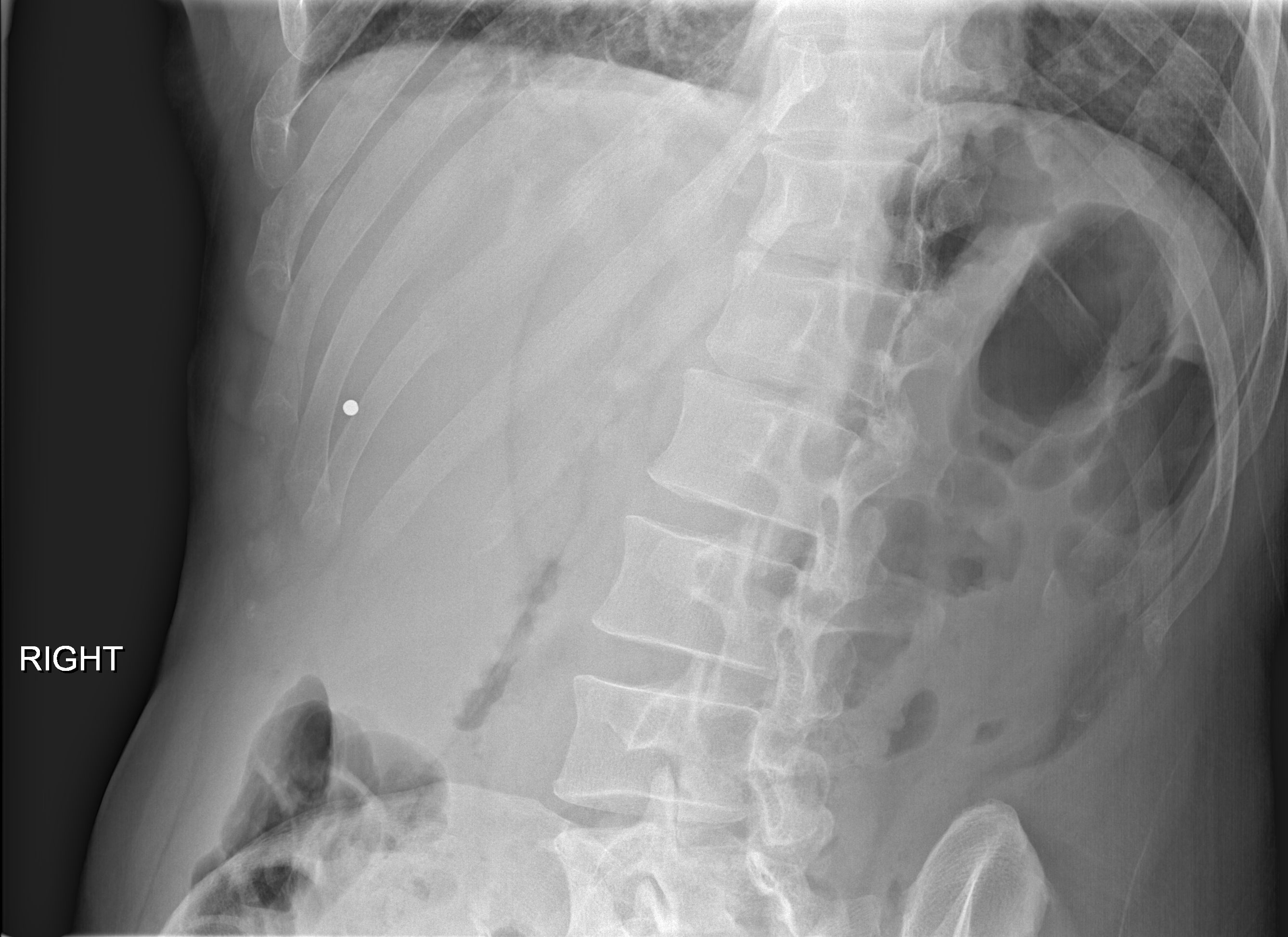

[4 of 4 positions shown; findings below may reference images not displayed]

FINDINGS: Single-view of the chest demonstrates a small amount of pleural
fluid on the right. No pneumothorax. Mild right basilar atelectasis
is seen. Heart size is normal. There is subtle step-off along the
lateral arc of the right tenth rib compatible with a nondisplaced
fracture. No other fracture is identified.
IMPRESSION: Nondisplaced right tenth rib fracture.

Small right pleural effusion.  Negative for pneumothorax.

## 2019-03-20 ENCOUNTER — Ambulatory Visit: Payer: Self-pay | Admitting: Family Medicine

## 2019-05-08 ENCOUNTER — Ambulatory Visit: Payer: Self-pay | Admitting: Family Medicine

## 2019-05-08 ENCOUNTER — Encounter: Payer: Self-pay | Admitting: Family Medicine

## 2019-05-08 VITALS — BP 168/86 | HR 69 | Temp 98.3°F | Ht 73.0 in | Wt 180.0 lb

## 2019-05-08 DIAGNOSIS — Z7689 Persons encountering health services in other specified circumstances: Secondary | ICD-10-CM

## 2019-05-08 DIAGNOSIS — F1010 Alcohol abuse, uncomplicated: Secondary | ICD-10-CM

## 2019-05-08 DIAGNOSIS — I10 Essential (primary) hypertension: Secondary | ICD-10-CM

## 2019-05-08 MED ORDER — HYDROCHLOROTHIAZIDE 25 MG PO TABS: 25.0000 mg | ORAL_TABLET | Freq: Every day | ORAL | 3 refills | Status: DC

## 2019-05-08 NOTE — Patient Instructions (Addendum)
Start taking hydrochlorothiazide (HCTZ)  1 tab daily Check BP at home daily at least 1 hr after taking med and keep a log of these readings Schedule follow-up appt in 1.5-2wks (can be in-person or via telephone) Work to cut back on alcohol intake, salt intake Work to increase physical activity  Hypertension Hypertension, commonly called high blood pressure, is when the force of blood pumping through the arteries is too strong. The arteries are the blood vessels that carry blood from the heart throughout the body. Hypertension forces the heart to work harder to pump blood and may cause arteries to become narrow or stiff. Having untreated or uncontrolled hypertension can cause heart attacks, strokes, kidney disease, and other problems. A blood pressure reading consists of a higher number over a lower number. Ideally, your blood pressure should be below 120/80. The first ("top") number is called the systolic pressure. It is a measure of the pressure in your arteries as your heart beats. The second ("bottom") number is called the diastolic pressure. It is a measure of the pressure in your arteries as the heart relaxes. What are the causes? The cause of this condition is not known. What increases the risk? Some risk factors for high blood pressure are under your control. Others are not. Factors you can change  Smoking.  Having type 2 diabetes mellitus, high cholesterol, or both.  Not getting enough exercise or physical activity.  Being overweight.  Having too much fat, sugar, calories, or salt (sodium) in your diet.  Drinking too much alcohol. Factors that are difficult or impossible to change  Having chronic kidney disease.  Having a family history of high blood pressure.  Age. Risk increases with age.  Race. You may be at higher risk if you are African-American.  Gender. Men are at higher risk than women before age 16. After age 62, women are at higher risk than men.  Having  obstructive sleep apnea.  Stress. What are the signs or symptoms? Extremely high blood pressure (hypertensive crisis) may cause:  Headache.  Anxiety.  Shortness of breath.  Nosebleed.  Nausea and vomiting.  Severe chest pain.  Jerky movements you cannot control (seizures). How is this diagnosed? This condition is diagnosed by measuring your blood pressure while you are seated, with your arm resting on a surface. The cuff of the blood pressure monitor will be placed directly against the skin of your upper arm at the level of your heart. It should be measured at least twice using the same arm. Certain conditions can cause a difference in blood pressure between your right and left arms. Certain factors can cause blood pressure readings to be lower or higher than normal (elevated) for a short period of time:  When your blood pressure is higher when you are in a health care provider's office than when you are at home, this is called white coat hypertension. Most people with this condition do not need medicines.  When your blood pressure is higher at home than when you are in a health care provider's office, this is called masked hypertension. Most people with this condition may need medicines to control blood pressure. If you have a high blood pressure reading during one visit or you have normal blood pressure with other risk factors:  You may be asked to return on a different day to have your blood pressure checked again.  You may be asked to monitor your blood pressure at home for 1 week or longer. If you are  diagnosed with hypertension, you may have other blood or imaging tests to help your health care provider understand your overall risk for other conditions. How is this treated? This condition is treated by making healthy lifestyle changes, such as eating healthy foods, exercising more, and reducing your alcohol intake. Your health care provider may prescribe medicine if lifestyle  changes are not enough to get your blood pressure under control, and if:  Your systolic blood pressure is above 130.  Your diastolic blood pressure is above 80. Your personal target blood pressure may vary depending on your medical conditions, your age, and other factors. Follow these instructions at home: Eating and drinking   Eat a diet that is high in fiber and potassium, and low in sodium, added sugar, and fat. An example eating plan is called the DASH (Dietary Approaches to Stop Hypertension) diet. To eat this way: ? Eat plenty of fresh fruits and vegetables. Try to fill half of your plate at each meal with fruits and vegetables. ? Eat whole grains, such as whole wheat pasta, brown rice, or whole grain bread. Fill about one quarter of your plate with whole grains. ? Eat or drink low-fat dairy products, such as skim milk or low-fat yogurt. ? Avoid fatty cuts of meat, processed or cured meats, and poultry with skin. Fill about one quarter of your plate with lean proteins, such as fish, chicken without skin, beans, eggs, and tofu. ? Avoid premade and processed foods. These tend to be higher in sodium, added sugar, and fat.  Reduce your daily sodium intake. Most people with hypertension should eat less than 1,500 mg of sodium a day.  Limit alcohol intake to no more than 1 drink a day for nonpregnant women and 2 drinks a day for men. One drink equals 12 oz of beer, 5 oz of wine, or 1 oz of hard liquor. Lifestyle   Work with your health care provider to maintain a healthy body weight or to lose weight. Ask what an ideal weight is for you.  Get at least 30 minutes of exercise that causes your heart to beat faster (aerobic exercise) most days of the week. Activities may include walking, swimming, or biking.  Include exercise to strengthen your muscles (resistance exercise), such as pilates or lifting weights, as part of your weekly exercise routine. Try to do these types of exercises for 30  minutes at least 3 days a week.  Do not use any products that contain nicotine or tobacco, such as cigarettes and e-cigarettes. If you need help quitting, ask your health care provider.  Monitor your blood pressure at home as told by your health care provider.  Keep all follow-up visits as told by your health care provider. This is important. Medicines  Take over-the-counter and prescription medicines only as told by your health care provider. Follow directions carefully. Blood pressure medicines must be taken as prescribed.  Do not skip doses of blood pressure medicine. Doing this puts you at risk for problems and can make the medicine less effective.  Ask your health care provider about side effects or reactions to medicines that you should watch for. Contact a health care provider if:  You think you are having a reaction to a medicine you are taking.  You have headaches that keep coming back (recurring).  You feel dizzy.  You have swelling in your ankles.  You have trouble with your vision. Get help right away if:  You develop a severe headache or confusion.  You have unusual weakness or numbness.  You feel faint.  You have severe pain in your chest or abdomen.  You vomit repeatedly.  You have trouble breathing. Summary  Hypertension is when the force of blood pumping through your arteries is too strong. If this condition is not controlled, it may put you at risk for serious complications.  Your personal target blood pressure may vary depending on your medical conditions, your age, and other factors. For most people, a normal blood pressure is less than 120/80.  Hypertension is treated with lifestyle changes, medicines, or a combination of both. Lifestyle changes include weight loss, eating a healthy, low-sodium diet, exercising more, and limiting alcohol. This information is not intended to replace advice given to you by your health care provider. Make sure you  discuss any questions you have with your health care provider. Document Released: 12/17/2005 Document Revised: 11/14/2016 Document Reviewed: 11/14/2016 Elsevier Interactive Patient Education  2019 [employer]   DASH Eating Plan DASH stands for "Dietary Approaches to Stop Hypertension." The DASH eating plan is a healthy eating plan that has been shown to reduce high blood pressure (hypertension). It may also reduce your risk for type 2 diabetes, heart disease, and stroke. The DASH eating plan may also help with weight loss. What are tips for following this plan?  General guidelines Avoid eating more than 2,300 mg (milligrams) of salt (sodium) a day. If you have hypertension, you may need to reduce your sodium intake to 1,500 mg a day. Limit alcohol intake to no more than 1 drink a day for nonpregnant women and 2 drinks a day for men. One drink equals 12 oz of beer, 5 oz of wine, or 1 oz of hard liquor. Work with your health care provider to maintain a healthy body weight or to lose weight. Ask what an ideal weight is for you. Get at least 30 minutes of exercise that causes your heart to beat faster (aerobic exercise) most days of the week. Activities may include walking, swimming, or biking. Work with your health care provider or diet and nutrition specialist (dietitian) to adjust your eating plan to your individual calorie needs. Reading food labels  Check food labels for the amount of sodium per serving. Choose foods with less than 5 percent of the Daily Value of sodium. Generally, foods with less than 300 mg of sodium per serving fit into this eating plan. To find whole grains, look for the word "whole" as the first word in the ingredient list. Shopping Buy products labeled as "low-sodium" or "no salt added." Buy fresh foods. Avoid canned foods and premade or frozen meals. Cooking Avoid adding salt when cooking. Use salt-free seasonings or herbs instead of table salt or sea salt. Check  with your health care provider or pharmacist before using salt substitutes. Do not fry foods. Cook foods using healthy methods such as baking, boiling, grilling, and broiling instead. Cook with heart-healthy oils, such as olive, canola, soybean, or sunflower oil. Meal planning Eat a balanced diet that includes: 5 or more servings of fruits and vegetables each day. At each meal, try to fill half of your plate with fruits and vegetables. Up to 6-8 servings of whole grains each day. Less than 6 oz of lean meat, poultry, or fish each day. A 3-oz serving of meat is about the same size as a deck of cards. One egg equals 1 oz. 2 servings of low-fat dairy each day. A serving of nuts, seeds, or beans 5  times each week. Heart-healthy fats. Healthy fats called Omega-3 fatty acids are found in foods such as flaxseeds and coldwater fish, like sardines, salmon, and mackerel. Limit how much you eat of the following: Canned or prepackaged foods. Food that is high in trans fat, such as fried foods. Food that is high in saturated fat, such as fatty meat. Sweets, desserts, sugary drinks, and other foods with added sugar. Full-fat dairy products. Do not salt foods before eating. Try to eat at least 2 vegetarian meals each week. Eat more home-cooked food and less restaurant, buffet, and fast food. When eating at a restaurant, ask that your food be prepared with less salt or no salt, if possible. What foods are recommended? The items listed may not be a complete list. Talk with your dietitian about what dietary choices are best for you. Grains Whole-grain or whole-wheat bread. Whole-grain or whole-wheat pasta. Brown rice. Orpah Cobbatmeal. Quinoa. Bulgur. Whole-grain and low-sodium cereals. Pita bread. Low-fat, low-sodium crackers. Whole-wheat flour tortillas. Vegetables Fresh or frozen vegetables (raw, steamed, roasted, or grilled). Low-sodium or reduced-sodium tomato and vegetable juice. Low-sodium or reduced-sodium  tomato sauce and tomato paste. Low-sodium or reduced-sodium canned vegetables. Fruits All fresh, dried, or frozen fruit. Canned fruit in natural juice (without added sugar). Meat and other protein foods Skinless chicken or Mal[place]urkey. Ground chicken or [place]urkey. Pork with fat trimmed off. Fish and seafood. Egg whites. Dried beans, peas, or lentils. Unsalted nuts, nut butters, and seeds. Unsalted canned beans. Lean cuts of beef with fat trimmed off. Low-sodium, lean deli meat. Dairy Low-fat (1%) or fat-free (skim) milk. Fat-free, low-fat, or reduced-fat cheeses. Nonfat, low-sodium ricotta or cottage cheese. Low-fat or nonfat yogurt. Low-fat, low-sodium cheese. Fats and oils Soft margarine without trans fats. Vegetable oil. Low-fat, reduced-fat, or light mayonnaise and salad dressings (reduced-sodium). Canola, safflower, olive, soybean, and sunflower oils. Avocado. Seasoning and other foods Herbs. Spices. Seasoning mixes without salt. Unsalted popcorn and pretzels. Fat-free sweets. What foods are not recommended? The items listed may not be a complete list. Talk with your dietitian about what dietary choices are best for you. Grains Baked goods made with fat, such as croissants, muffins, or some breads. Dry pasta or rice meal packs. Vegetables Creamed or fried vegetables. Vegetables in a cheese sauce. Regular canned vegetables (not low-sodium or reduced-sodium). Regular canned tomato sauce and paste (not low-sodium or reduced-sodium). Regular tomato and vegetable juice (not low-sodium or reduced-sodium). Rosita FirePickles. Olives. Fruits Canned fruit in a light or heavy syrup. Fried fruit. Fruit in cream or butter sauce. Meat and other protein foods Fatty cuts of meat. Ribs. Fried meat. Tomasa BlaseBacon. Sausage. Bologna and other processed lunch meats. Salami. Fatback. Hotdogs. Bratwurst. Salted nuts and seeds. Canned beans with added salt. Canned or smoked fish. Whole eggs or egg yolks. Chicken or [place]urkey with skin. Dairy  Whole or 2% milk, cream, and half-and-half. Whole or full-fat cream cheese. Whole-fat or sweetened yogurt. Full-fat cheese. Nondairy creamers. Whipped toppings. Processed cheese and cheese spreads. Fats and oils Butter. Stick margarine. Lard. Shortening. Ghee. Bacon fat. Tropical oils, such as coconut, palm kernel, or palm oil. Seasoning and other foods Salted popcorn and pretzels. Onion salt, garlic salt, seasoned salt, table salt, and sea salt. Worcestershire sauce. Tartar sauce. Barbecue sauce. Teriyaki sauce. Soy sauce, including reduced-sodium. Steak sauce. Canned and packaged gravies. Fish sauce. Oyster sauce. Cocktail sauce. Horseradish that you find on the shelf. Ketchup. Mustard. Meat flavorings and tenderizers. Bouillon cubes. Hot sauce and Tabasco sauce. Premade or packaged marinades. Premade or packaged  taco seasonings. Relishes. Regular salad dressings. Where to find more information: National Heart, Lung, and Blood Institute: PopSteam.is American Heart Association: www.heart.org Summary The DASH eating plan is a healthy eating plan that has been shown to reduce high blood pressure (hypertension). It may also reduce your risk for type 2 diabetes, heart disease, and stroke. With the DASH eating plan, you should limit salt (sodium) intake to 2,300 mg a day. If you have hypertension, you may need to reduce your sodium intake to 1,500 mg a day. When on the DASH eating plan, aim to eat more fresh fruits and vegetables, whole grains, lean proteins, low-fat dairy, and heart-healthy fats. Work with your health care provider or diet and nutrition specialist (dietitian) to adjust your eating plan to your individual calorie needs. This information is not intended to replace advice given to you by your health care provider. Make sure you discuss any questions you have with your health care provider. Document Released: 12/06/2011 Document Revised: 12/10/2016 Document Reviewed: 12/10/2016  Elsevier Interactive Patient Education  2019 [employer].

## 2019-05-08 NOTE — Progress Notes (Signed)
Gary Simmons is a 59 y.o. male  Chief Complaint  Patient presents with  . Establish Care    est care/ BP concerns    HPI: Gary Simmons is a 59 y.o. male new to our office to establish care and discuss concern about his blood pressure. 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 He was advised to establish with PCP for evaluation and management of BP and then return for re-eval.  Pt denies HA, dizziness, lightheadedness, CP, SOB, palpitations, n/v, LE edema.  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 Pt has done inpatient detox (2014) and was sober for a few months but then resumed drinking  Patient Active Problem List   Diagnosis Date Noted  . Essential hypertension 05/08/2019  . Alcohol abuse 07/29/2013    Past Medical History:  Diagnosis Date  . Alcohol abuse     History reviewed. No pertinent surgical history.  Social History   Socioeconomic History  . Marital status: Single    Spouse name: Not on file  . Number of children: Not on file  . Years of education: Not on file  . Highest education level: Not on file  Occupational History  . Not on file  Social Needs  . Financial resource strain: Not on file  . Food insecurity:    Worry: Not on file    Inability: Not on file  . Transportation needs:    Medical: Not on file   Non-medical: Not on file  Tobacco Use  . Smoking status: Former Games developer  . Smokeless tobacco: Never Used  Substance and Sexual Activity  . Alcohol use: Yes    Alcohol/week: 28.0 standard drinks    Types: 28 Cans of beer per week    Comment: pt drinks daily - 4-5 beers per day and sometimes up to 1 pint of vodka  . Drug use: No  . Sexual activity: Yes    Birth control/protection: None  Lifestyle  . Physical activity:    Days per week: Not on file    Minutes per session: Not on file  . Stress: Not on file  Relationships  . Social connections:    Talks on phone: Not on file    Gets together: Not on file    Attends religious service: Not on file    Active member of club or organization: Not on file    Attends meetings of clubs or organizations: Not on file    Relationship status: Not on file  . Intimate partner violence:    Fear of current or ex partner: Not on file    Emotionally abused: Not on file    Physically abused: Not on file  Forced sexual activity: Not on file  Other Topics Concern  . Not on file  Social History Narrative  . Not on file    Family History  Problem Relation Age of Onset  . Hypertension Father   . Alcohol abuse Father   . Cancer Mother 1850       lung cancer       There is no immunization history on file for this patient.  Outpatient Encounter Medications as of 05/08/2019  Medication Sig  . hydrochlorothiazide (HYDRODIURIL) 25 MG tablet Take 1 tablet (25 mg total) by mouth daily.  . [DISCONTINUED] naproxen (NAPROSYN) 500 MG tablet Take 1 tablet (500 mg total) by mouth 2 (two) times daily. (Patient not taking: Reported on 05/08/2019)  . [DISCONTINUED] traZODone (DESYREL) 50 MG tablet Take 1 tablet (50 mg total) by mouth at bedtime and may repeat dose one time if needed. (Patient not taking: Reported on 09/17/2017)   No facility-administered encounter medications on file as of 05/08/2019.      ROS: Pertinent positives and negatives noted in HPI.  Remainder of ROS non-contributory  No Known Allergies  BP (!) 168/86   Pulse 69   Temp 98.3 F (36.8 C) (Oral)   Ht 6\' 1"  (1.854 m)   Wt 180 lb (81.6 kg)   SpO2 99%   BMI 23.75 kg/m   BP Readings from Last 3 Encounters:  05/08/19 (!) 168/86  09/17/17 (!) 161/99  08/01/13 106/72    Physical Exam  Constitutional: He is oriented to person, place, and time. He appears well-developed and well-nourished. No distress.  Neck: Neck supple. No JVD present. No thyromegaly present.  Cardiovascular: Normal rate, regular rhythm and normal heart sounds.  No murmur heard. Pulmonary/Chest: Effort normal and breath sounds normal. No respiratory distress.  Musculoskeletal:        General: No edema.  Neurological: He is alert and oriented to person, place, and time.     A/P:  1. Encounter to establish care with new doctor  2. Essential hypertension - uncontrolled, not at goal - discussed importance of reducing his sodium intake, reducing or eliminating his alcohol intake, and increasing CV exercise Rx: - hydrochlorothiazide (HYDRODIURIL) 25 MG tablet; Take 1 tablet (25 mg total) by mouth daily.  Dispense: 90 tablet; Refill: 3 - pt has BP cuff at home and is agreeable to checking his BP daily and keeping log of this - f/u in 1.5-2 wks or sooner PRN Discussed plan and reviewed medications with patient, including risks, benefits, and potential side effects. Pt expressed understand. All questions answered.  3. Alcohol abuse - pt has been drinking since 59yo with daily and excessive use for the past 20 years  - he states he would like to quit but isn't sure he's able or "down for quitting" at least at this time - pt declines info about AA or other substance abuse counseling at this time

## 2019-05-22 ENCOUNTER — Ambulatory Visit (INDEPENDENT_AMBULATORY_CARE_PROVIDER_SITE_OTHER): Payer: Self-pay | Admitting: Family Medicine

## 2019-05-22 ENCOUNTER — Encounter: Payer: Self-pay | Admitting: Family Medicine

## 2019-05-22 VITALS — BP 146/77 | Ht 73.0 in

## 2019-05-22 DIAGNOSIS — I10 Essential (primary) hypertension: Secondary | ICD-10-CM

## 2019-05-22 DIAGNOSIS — F1010 Alcohol abuse, uncomplicated: Secondary | ICD-10-CM

## 2019-05-22 MED ORDER — LOSARTAN POTASSIUM-HCTZ 50-12.5 MG PO TABS: 1.0000 | ORAL_TABLET | Freq: Every day | ORAL | 3 refills | Status: DC

## 2019-05-22 NOTE — Progress Notes (Signed)
Virtual Visit via Telephone Note  I connected with Gary Simmons on 05/22/19 at  1:00 PM EDT by telephone and verified that I am speaking with the correct person using two identifiers.   I discussed the limitations, risks, security and privacy concerns of performing an evaluation and management service by telephone and the availability of in person appointments. I also discussed with the patient that there may be a patient responsible charge related to this service. The patient expressed understanding and agreed to proceed.  Location patient: home Location provider: work  Participants present for the call: patient, provider Patient did not have a visit in the prior 7 days to address this/these issue(s).  Chief Complaint  Patient presents with  . Follow-up    F/U BP 140/81/ no complaints     History of Present Illness: Gary Simmons is a 59 y.o. male for HTN f/u today. He was seen by me on 05/08/19 to establish care and address his elevated BP readings. At that time, pt was started on HCTZ 25mg  daily, advised to check BP at home, and f/u in 2 weeks for re-evaluation. Pt checked his BP with home wrist cuff about 1 week ago and BP = 140/81. BP today is 146/77. He is taking med daily other than yesterday when he missed his dose due to being late for work. No reported side effects.  Since last OV, he has stopped drinking vodka. 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  Denies HA, vision changes, CP, SOB, LE edema, dizziness.   Past Medical History:  Diagnosis Date  . Alcohol abuse     History reviewed. No pertinent surgical history.  Social History   Tobacco Use  . Smoking status: Former Games developermoker  . Smokeless tobacco: Never Used  Substance Use Topics  . Alcohol use: Yes    Alcohol/week: 28.0 standard drinks    Types: 28 Cans of beer per week    Comment: pt  drinks daily - 4-5 beers per day and sometimes up to 1 pint of vodka  . Drug use: No    Family History  Problem Relation Age of Onset  . Hypertension Father   . Alcohol abuse Father   . Cancer Mother 2150       lung cancer     Outpatient Encounter Medications as of 05/22/2019  Medication Sig  . hydrochlorothiazide (HYDRODIURIL) 25 MG tablet Take 1 tablet (25 mg total) by mouth daily.   No facility-administered encounter medications on file as of 05/22/2019.      No Known Allergies    ROS: See pertinent positives and negatives per HPI.   Observations/Objective: Patient sounds cheerful and well on the phone. I do not appreciate any SOB. Speech and thought processing are grossly intact. Patient reported vitals:  BP (!) 146/77   Ht 6\' 1"  (1.854 m)   BMI 23.75 kg/m    Assessment and Plan:  1. Essential hypertension - BP improved on HCTZ but would like to see SBP > 140 - d/c HCTZ 25mg  daily and start losartan/HCTZ 50-12.5mg  1 tab daily (pt did not want to cont HCTZ 25mg  and add another pill, preferred combo) - pt to check BP daily to every other day x 1 week and keep log - appt in 1 week  2. Alcohol abuse - pt has stopped drinking vodka since last appt  with me - he is down to 5 beers per day at this time - he declines info regarding substance abuse counseling, AA, etc   I did not refer this patient for an OV in the next 24 hours for this/these issue(s).  I discussed the assessment and treatment plan with the patient. The patient was provided an opportunity to ask questions and all were answered. The patient agreed with the plan and demonstrated an understanding of the instructions.   The patient was advised to call back or seek an in-person evaluation if the symptoms worsen or if the condition fails to improve as anticipated.  I provided 12 minutes of non-face-to-face time during this encounter.   Luana Shu, DO

## 2019-05-29 ENCOUNTER — Ambulatory Visit: Payer: Self-pay | Admitting: Family Medicine

## 2019-10-06 ENCOUNTER — Ambulatory Visit: Payer: Self-pay | Admitting: Emergency Medicine

## 2019-10-07 ENCOUNTER — Encounter: Payer: Self-pay | Admitting: Emergency Medicine

## 2019-12-03 ENCOUNTER — Ambulatory Visit: Payer: Self-pay | Admitting: Emergency Medicine

## 2019-12-10 ENCOUNTER — Other Ambulatory Visit: Payer: Self-pay

## 2019-12-10 ENCOUNTER — Encounter: Payer: Self-pay | Admitting: Emergency Medicine

## 2019-12-10 ENCOUNTER — Ambulatory Visit (INDEPENDENT_AMBULATORY_CARE_PROVIDER_SITE_OTHER): Payer: Self-pay | Admitting: Emergency Medicine

## 2019-12-10 VITALS — BP 182/104 | HR 91 | Temp 98.4°F | Resp 16 | Ht 73.0 in | Wt 180.8 lb

## 2019-12-10 DIAGNOSIS — I10 Essential (primary) hypertension: Secondary | ICD-10-CM

## 2019-12-10 DIAGNOSIS — Z024 Encounter for examination for driving license: Secondary | ICD-10-CM

## 2019-12-10 NOTE — Patient Instructions (Addendum)
Must follow-up with PCP for hypertension management sometime in the next 3 months. Return here once blood pressure issues are addressed and blood pressure is under control. We will extend DOT certification then as appropriate.  DOT certification good for 3 months starting today.   If you have lab work done today you will be contacted with your lab results within the next 2 weeks.  If you have not heard from us then please contact us. The fastest way to get your results is to register for My Chart.   IF you received an x-ray today, you will receive an invoice from [facility]. Please contact [facility] at [phone] with questions or concerns regarding your invoice.   IF you received labwork today, you will receive an invoice from [facility]. Please contact [facility] at [phone] with questions or concerns regarding your invoice.   Our billing staff will not be able to assist you with questions regarding bills from these companies.  You will be contacted with the lab results as soon as they are available. The fastest way to get your results is to activate your My Chart account. Instructions are located on the last page of this paperwork. If you have not heard from us regarding the results in 2 weeks, please contact this office.     Hypertension, Adult High blood pressure (hypertension) is when the force of blood pumping through the arteries is too strong. The arteries are the blood vessels that carry blood from the heart throughout the body. Hypertension forces the heart to work harder to pump blood and may cause arteries to become narrow or stiff. Untreated or uncontrolled hypertension can cause a heart attack, heart failure, a stroke, kidney disease, and other problems. A blood pressure reading consists of a higher number over a lower number. Ideally, your blood pressure should be below 120/80. The first ("top") number is called the systolic pressure. It is a measure of  the pressure in your arteries as your heart beats. The second ("bottom") number is called the diastolic pressure. It is a measure of the pressure in your arteries as the heart relaxes. What are the causes? The exact cause of this condition is not known. There are some conditions that result in or are related to high blood pressure. What increases the risk? Some risk factors for high blood pressure are under your control. The following factors may make you more likely to develop this condition:  Smoking.  Having type 2 diabetes mellitus, high cholesterol, or both.  Not getting enough exercise or physical activity.  Being overweight.  Having too much fat, sugar, calories, or salt (sodium) in your diet.  Drinking too much alcohol. Some risk factors for high blood pressure may be difficult or impossible to change. Some of these factors include:  Having chronic kidney disease.  Having a family history of high blood pressure.  Age. Risk increases with age.  Race. You may be at higher risk if you are African American.  Gender. Men are at higher risk than women before age 59. After age 59, women are at higher risk than men.  Having obstructive sleep apnea.  Stress. What are the signs or symptoms? High blood pressure may not cause symptoms. Very high blood pressure (hypertensive crisis) may cause:  Headache.  Anxiety.  Shortness of breath.  Nosebleed.  Nausea and vomiting.  Vision changes.  Severe chest pain.  Seizures. How is this diagnosed? This condition is diagnosed by measuring your blood pressure while you are  seated, with your arm resting on a flat surface, your legs uncrossed, and your feet flat on the floor. The cuff of the blood pressure monitor will be placed directly against the skin of your upper arm at the level of your heart. It should be measured at least twice using the same arm. Certain conditions can cause a difference in blood pressure between your right  and left arms. Certain factors can cause blood pressure readings to be lower or higher than normal for a short period of time:  When your blood pressure is higher when you are in a health care provider's office than when you are at home, this is called white coat hypertension. Most people with this condition do not need medicines.  When your blood pressure is higher at home than when you are in a health care provider's office, this is called masked hypertension. Most people with this condition may need medicines to control blood pressure. If you have a high blood pressure reading during one visit or you have normal blood pressure with other risk factors, you may be asked to:  Return on a different day to have your blood pressure checked again.  Monitor your blood pressure at home for 1 week or longer. If you are diagnosed with hypertension, you may have other blood or imaging tests to help your health care provider understand your overall risk for other conditions. How is this treated? This condition is treated by making healthy lifestyle changes, such as eating healthy foods, exercising more, and reducing your alcohol intake. Your health care provider may prescribe medicine if lifestyle changes are not enough to get your blood pressure under control, and if:  Your systolic blood pressure is above 130.  Your diastolic blood pressure is above 80. Your personal target blood pressure may vary depending on your medical conditions, your age, and other factors. Follow these instructions at home: Eating and drinking   Eat a diet that is high in fiber and potassium, and low in sodium, added sugar, and fat. An example eating plan is called the DASH (Dietary Approaches to Stop Hypertension) diet. To eat this way: ? Eat plenty of fresh fruits and vegetables. Try to fill one half of your plate at each meal with fruits and vegetables. ? Eat whole grains, such as whole-wheat pasta, brown rice, or  whole-grain bread. Fill about one fourth of your plate with whole grains. ? Eat or drink low-fat dairy products, such as skim milk or low-fat yogurt. ? Avoid fatty cuts of meat, processed or cured meats, and poultry with skin. Fill about one fourth of your plate with lean proteins, such as fish, chicken without skin, beans, eggs, or tofu. ? Avoid pre-made and processed foods. These tend to be higher in sodium, added sugar, and fat.  Reduce your daily sodium intake. Most people with hypertension should eat less than 1,500 mg of sodium a day.  Do not drink alcohol if: ? Your health care provider tells you not to drink. ? You are pregnant, may be pregnant, or are planning to become pregnant.  If you drink alcohol: ? Limit how much you use to:  0-1 drink a day for women.  0-2 drinks a day for men. ? Be aware of how much alcohol is in your drink. In the [place] one drink equals one 12 oz bottle of beer (355 mL), one 5 oz glass of wine (148 mL), or one 1 oz glass of hard liquor (44 mL). Lifestyle  Work with your health care provider to maintain a healthy body weight or to lose weight. Ask what an ideal weight is for you.  Get at least 30 minutes of exercise most days of the week. Activities may include walking, swimming, or biking.  Include exercise to strengthen your muscles (resistance exercise), such as Pilates or lifting weights, as part of your weekly exercise routine. Try to do these types of exercises for 30 minutes at least 3 days a week.  Do not use any products that contain nicotine or tobacco, such as cigarettes, e-cigarettes, and chewing tobacco. If you need help quitting, ask your health care provider.  Monitor your blood pressure at home as told by your health care provider.  Keep all follow-up visits as told by your health care provider. This is important. Medicines  Take over-the-counter and prescription medicines only as told by your health care provider. Follow  directions carefully. Blood pressure medicines must be taken as prescribed.  Do not skip doses of blood pressure medicine. Doing this puts you at risk for problems and can make the medicine less effective.  Ask your health care provider about side effects or reactions to medicines that you should watch for. Contact a health care provider if you:  Think you are having a reaction to a medicine you are taking.  Have headaches that keep coming back (recurring).  Feel dizzy.  Have swelling in your ankles.  Have trouble with your vision. Get help right away if you:  Develop a severe headache or confusion.  Have unusual weakness or numbness.  Feel faint.  Have severe pain in your chest or abdomen.  Vomit repeatedly.  Have trouble breathing. Summary  Hypertension is when the force of blood pumping through your arteries is too strong. If this condition is not controlled, it may put you at risk for serious complications.  Your personal target blood pressure may vary depending on your medical conditions, your age, and other factors. For most people, a normal blood pressure is less than 120/80.  Hypertension is treated with lifestyle changes, medicines, or a combination of both. Lifestyle changes include losing weight, eating a healthy, low-sodium diet, exercising more, and limiting alcohol. This information is not intended to replace advice given to you by your health care provider. Make sure you discuss any questions you have with your health care provider. Document Released: 12/17/2005 Document Revised: 08/27/2018 Document Reviewed: 08/27/2018 Elsevier Patient Education  2020 [employer].

## 2019-12-10 NOTE — Progress Notes (Signed)
This patient presents for DOT examination for fitness for duty.   Medical History:  1. Head/Brain Injuries, disorders or illnesses no  2. Seizures, epilepsy no  3. Eye disorders or impaired vision (except corrective lenses) no  4. Ear disorders, loss of hearing or balance no  5. Heart disease or heart attack, other cardiovascular condition no  6. Heart surgery (valve replacement/bypass, angioplasty, pacemaker/defribrillator) no  7. High blood pressure yes  8. High cholesterol no  9. Chronic cough, shortness of breath or other breathing problems no  10. Lung disease (emphysema, asthma or chronic bronchitis) no  11. Kidney disease, dialysis no  12. Digestive problems  no  13. Diabetes or elevated blood sugar no                      If yes to #13, Insulin use n/a  14. Nervious or psychiatric disorders, e.g., severe depression no  15. Fainting or syncope no  16. Dizziness, headaches, numbness, tingling or memory loss no  17. Unexplained weight loss no  18. Stroke, TIA or paralysis no  19. Missing or impaired hand, arm, foot, leg, finger, toe no  20. Spinal injury or disease no  21. Bone, muscles or nerve problems no  22. Blood clots or bleeding bleeding disorders no  23. Cancer no  24. Chronic infection or other chronic diseases no  25. Sleep disorders, pauses in breathing while asleep, daytime sleepiness, loud snoring no  26. Have you ever had a sleep test? no  27.  Have you ever spent a night in the hospital? no  28. Have you ever had a broken bone? no  29. Have you or or do you use tobacco products? no  30. Regular, frequent alcohol use yes  31. Illegal substance use within the past 2 years no  32.  Have you ever failed a drug test or been dependent on an illegal substance? no   Current Medications: Prior to Admission medications   Medication Sig Start Date End Date Taking? Authorizing Provider  losartan-hydrochlorothiazide (HYZAAR) 50-12.5 MG tablet Take 1 tablet by  mouth daily. 05/22/19  Yes Cirigliano, Mary K, DO  hydrochlorothiazide (HYDRODIURIL) 25 MG tablet Take 1 tablet (25 mg total) by mouth daily. Patient not taking: Reported on 12/10/2019 05/08/19   Overton Mam, DO    Medical Examiner's Comments on Health History:  Elevated blood pressure.  On medication.  Needs to follow-up with PCP. Otherwise in good general medical condition. Drinks 4-5 beers per day on the weekends and on days he is not working.  TESTING:  No exam data present  Monocular Vision: No.  Hearing Aid used for test: No. Hearing Aid required to to meet standard: No.  BP (!) 182/104   Pulse 91   Temp 98.4 F (36.9 C) (Oral)   Resp 16   Ht  (1.854 m) Comment: per pt  Wt 180 lb 12.8 oz (82 kg)   SpO2 100%   BMI 23.85 kg/m  Repeat blood pressure 160/100. Pulse rate is regular     PHYSICAL EXAMINATION:  General Appearance Not markedly obese. No tremor, signs of alcoholism, problem drinking or drug abuse.   Skin Warm, dry and intact.   Eyes Pupils are equal, round and reactive to light and accommodation, extraocular movements are intact. No exophthalmos, no nystagmus.   Ears Normal external ears. External canal without occlusion. No scarring of the TM. No perforation of the TM.  Mouth and  Throat Clear and moist. No irremedial deformities likely to interfere with breathing or swallowing.  Heart No murmurs, extra sounds, evidence of cardiomegaly. No pacemaker. No implantable defibrillator.  Lungs and Chest (excluding breasts) Normal chest expansion, respiratory rate, breath sounds. No cyanosis.  Abdomen and Viscera No liver enlargement. No splenic enlargement. No masses, bruits, hernias or significant abdominal wall weakness.  Genitourinary  No inguinal or femoral hernia.  Spine and other musculoskeletal No tenderness, no limitation of motion, no deformities. No evidence of previous surgery.  Extremities No loss or impairment of leg, foot, toe, arm, hand,  finger. No perceptible limp, deformities, atrophy, weakness, paralysis, clubbing, edema, hypotonia. Patient has sufficient grasp and prehension to maintain steering wheel grip. Patient has sufficient mobility and strength in the lower limbs to operate pedals properly.  Neurologic Normal equilibrium, coordination, speech pattern. No paresthesia, asymmetry of deep tendon reflexes, sensory or positional abnormalities. No abnormality of patellar or Babinski's reflexes.  Gait Not antalgic or ataxic  Vascular Normal pulses. No carotid or arterial bruits. No varicose veins.     Certification Status:  Meets standards, but periodic monitoring required due to: HTN  Driver qualified only for: 3 months    Certification expires 03/09/2020.   Patient Instructions    Must follow-up with PCP for hypertension management sometime in the next 3 months. Return here once blood pressure issues are addressed and blood pressure is under control. We will extend DOT certification then as appropriate.  DOT certification good for 3 months starting today.   If you have lab work done today you will be contacted with your lab results within the next 2 weeks.  If you have not heard from us then please contact us. The fastest way to get your results is to register for My Chart.   IF you received an x-ray today, you will receive an invoice from [facility]. Please contact [facility] at [phone] with questions or concerns regarding your invoice.   IF you received labwork today, you will receive an invoice from [facility]. Please contact [facility] at [phone] with questions or concerns regarding your invoice.   Our billing staff will not be able to assist you with questions regarding bills from these companies.  You will be contacted with the lab results as soon as they are available. The fastest way to get your results is to activate your My Chart account. Instructions are located on the last  page of this paperwork. If you have not heard from us regarding the results in 2 weeks, please contact this office.     Hypertension, Adult High blood pressure (hypertension) is when the force of blood pumping through the arteries is too strong. The arteries are the blood vessels that carry blood from the heart throughout the body. Hypertension forces the heart to work harder to pump blood and may cause arteries to become narrow or stiff. Untreated or uncontrolled hypertension can cause a heart attack, heart failure, a stroke, kidney disease, and other problems. A blood pressure reading consists of a higher number over a lower number. Ideally, your blood pressure should be below 120/80. The first ("top") number is called the systolic pressure. It is a measure of the pressure in your arteries as your heart beats. The second ("bottom") number is called the diastolic pressure. It is a measure of the pressure in your arteries as the heart relaxes. What are the causes? The exact cause of this condition is not known. There are some conditions that result in  or are related to high blood pressure. What increases the risk? Some risk factors for high blood pressure are under your control. The following factors may make you more likely to develop this condition:  Smoking.  Having type 2 diabetes mellitus, high cholesterol, or both.  Not getting enough exercise or physical activity.  Being overweight.  Having too much fat, sugar, calories, or salt (sodium) in your diet.  Drinking too much alcohol. Some risk factors for high blood pressure may be difficult or impossible to change. Some of these factors include:  Having chronic kidney disease.  Having a family history of high blood pressure.  Age. Risk increases with age.  Race. You may be at higher risk if you are African American.  Gender. Men are at higher risk than women before age 33. After age 42, women are at higher risk than men.  Having  obstructive sleep apnea.  Stress. What are the signs or symptoms? High blood pressure may not cause symptoms. Very high blood pressure (hypertensive crisis) may cause:  Headache.  Anxiety.  Shortness of breath.  Nosebleed.  Nausea and vomiting.  Vision changes.  Severe chest pain.  Seizures. How is this diagnosed? This condition is diagnosed by measuring your blood pressure while you are seated, with your arm resting on a flat surface, your legs uncrossed, and your feet flat on the floor. The cuff of the blood pressure monitor will be placed directly against the skin of your upper arm at the level of your heart. It should be measured at least twice using the same arm. Certain conditions can cause a difference in blood pressure between your right and left arms. Certain factors can cause blood pressure readings to be lower or higher than normal for a short period of time:  When your blood pressure is higher when you are in a health care provider's office than when you are at home, this is called white coat hypertension. Most people with this condition do not need medicines.  When your blood pressure is higher at home than when you are in a health care provider's office, this is called masked hypertension. Most people with this condition may need medicines to control blood pressure. If you have a high blood pressure reading during one visit or you have normal blood pressure with other risk factors, you may be asked to:  Return on a different day to have your blood pressure checked again.  Monitor your blood pressure at home for 1 week or longer. If you are diagnosed with hypertension, you may have other blood or imaging tests to help your health care provider understand your overall risk for other conditions. How is this treated? This condition is treated by making healthy lifestyle changes, such as eating healthy foods, exercising more, and reducing your alcohol intake. Your health  care provider may prescribe medicine if lifestyle changes are not enough to get your blood pressure under control, and if:  Your systolic blood pressure is above 130.  Your diastolic blood pressure is above 80. Your personal target blood pressure may vary depending on your medical conditions, your age, and other factors. Follow these instructions at home: Eating and drinking   Eat a diet that is high in fiber and potassium, and low in sodium, added sugar, and fat. An example eating plan is called the DASH (Dietary Approaches to Stop Hypertension) diet. To eat this way: ? Eat plenty of fresh fruits and vegetables. Try to fill one half of your plate  at each meal with fruits and vegetables. ? Eat whole grains, such as whole-wheat pasta, brown rice, or whole-grain bread. Fill about one fourth of your plate with whole grains. ? Eat or drink low-fat dairy products, such as skim milk or low-fat yogurt. ? Avoid fatty cuts of meat, processed or cured meats, and poultry with skin. Fill about one fourth of your plate with lean proteins, such as fish, chicken without skin, beans, eggs, or tofu. ? Avoid pre-made and processed foods. These tend to be higher in sodium, added sugar, and fat.  Reduce your daily sodium intake. Most people with hypertension should eat less than 1,500 mg of sodium a day.  Do not drink alcohol if: ? Your health care provider tells you not to drink. ? You are pregnant, may be pregnant, or are planning to become pregnant.  If you drink alcohol: ? Limit how much you use to:  0-1 drink a day for women.  0-2 drinks a day for men. ? Be aware of how much alcohol is in your drink. In the [place] one drink equals one 12 oz bottle of beer (355 mL), one 5 oz glass of wine (148 mL), or one 1 oz glass of hard liquor (44 mL). Lifestyle   Work with your health care provider to maintain a healthy body weight or to lose weight. Ask what an ideal weight is for you.  Get at least 30  minutes of exercise most days of the week. Activities may include walking, swimming, or biking.  Include exercise to strengthen your muscles (resistance exercise), such as Pilates or lifting weights, as part of your weekly exercise routine. Try to do these types of exercises for 30 minutes at least 3 days a week.  Do not use any products that contain nicotine or tobacco, such as cigarettes, e-cigarettes, and chewing tobacco. If you need help quitting, ask your health care provider.  Monitor your blood pressure at home as told by your health care provider.  Keep all follow-up visits as told by your health care provider. This is important. Medicines  Take over-the-counter and prescription medicines only as told by your health care provider. Follow directions carefully. Blood pressure medicines must be taken as prescribed.  Do not skip doses of blood pressure medicine. Doing this puts you at risk for problems and can make the medicine less effective.  Ask your health care provider about side effects or reactions to medicines that you should watch for. Contact a health care provider if you:  Think you are having a reaction to a medicine you are taking.  Have headaches that keep coming back (recurring).  Feel dizzy.  Have swelling in your ankles.  Have trouble with your vision. Get help right away if you:  Develop a severe headache or confusion.  Have unusual weakness or numbness.  Feel faint.  Have severe pain in your chest or abdomen.  Vomit repeatedly.  Have trouble breathing. Summary  Hypertension is when the force of blood pumping through your arteries is too strong. If this condition is not controlled, it may put you at risk for serious complications.  Your personal target blood pressure may vary depending on your medical conditions, your age, and other factors. For most people, a normal blood pressure is less than 120/80.  Hypertension is treated with lifestyle  changes, medicines, or a combination of both. Lifestyle changes include losing weight, eating a healthy, low-sodium diet, exercising more, and limiting alcohol. This information is not intended to  replace advice given to you by your health care provider. Make sure you discuss any questions you have with your health care provider. Document Released: 12/17/2005 Document Revised: 08/27/2018 Document Reviewed: 08/27/2018 Elsevier Patient Education  2020 [employer].

## 2019-12-16 ENCOUNTER — Ambulatory Visit: Payer: Self-pay | Admitting: Family Medicine

## 2019-12-29 ENCOUNTER — Ambulatory Visit: Payer: Self-pay | Admitting: Family Medicine

## 2020-01-06 ENCOUNTER — Other Ambulatory Visit: Payer: Self-pay

## 2020-01-07 ENCOUNTER — Ambulatory Visit (INDEPENDENT_AMBULATORY_CARE_PROVIDER_SITE_OTHER): Payer: Self-pay | Admitting: Family Medicine

## 2020-01-07 ENCOUNTER — Telehealth: Payer: Self-pay | Admitting: Family Medicine

## 2020-01-07 ENCOUNTER — Encounter: Payer: Self-pay | Admitting: Family Medicine

## 2020-01-07 VITALS — Ht 73.0 in | Wt 180.0 lb

## 2020-01-07 DIAGNOSIS — I10 Essential (primary) hypertension: Secondary | ICD-10-CM

## 2020-01-07 MED ORDER — LOSARTAN POTASSIUM-HCTZ 50-12.5 MG PO TABS: 1.0000 | ORAL_TABLET | Freq: Every day | ORAL | 3 refills | Status: DC

## 2020-01-07 NOTE — Telephone Encounter (Signed)
[id number]   [address] [place], [place] [address] pharmacy # [phone].

## 2020-01-07 NOTE — Addendum Note (Signed)
Addended by: Overton Mam on: 01/07/2020 12:55 PM   Modules accepted: Orders

## 2020-01-07 NOTE — Telephone Encounter (Signed)
Rx sent to pharm pt provided and preferred pharm updated in chart

## 2020-01-07 NOTE — Progress Notes (Addendum)
Virtual Visit via Video Note  I connected with Gary Simmons on 01/07/20 at 10:00 AM EST by a video enabled telemedicine application and verified that I am speaking with the correct person using two identifiers. Location patient: home Location provider: work  Persons participating in the virtual visit: patient, provider  I discussed the limitations of evaluation and management by telemedicine and the availability of in person appointments. The patient expressed understanding and agreed to proceed.  Interactive audio and video telecommunications were attempted between myself and the patient, however failed, due to the patient having technical difficulties OR the patient did not have access to video capability. We continued and completed the visit with audio only.   Chief Complaint  Patient presents with  . 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 male to f/u on HTN. Pt was last seen by me in 05/2019 and more recently he had a DOD physical in 12/2019. BP was elevated at that time. He had been Rx'd losartan/HCTZ 50-12.5mg  1 tab daily in 05/2019 but did not get it and has been taking HCTZ 25mg  daily. Pt states he no longer drink hard liquor but still drinks beer.  He does not limit his sodium intake. No regular exercise.    Past Medical History:  Diagnosis Date  . Alcohol abuse     No past surgical history on file.  Family History  Problem Relation Age of Onset  . Hypertension Father   . Alcohol abuse Father   . Cancer Mother 82       lung cancer     Social History   Tobacco Use  . Smoking status: Former Research scientist (life sciences)  . Smokeless tobacco: Never Used  Substance Use Topics  . Alcohol use: Yes    Alcohol/week: 28.0 standard drinks    Types: 28 Cans of beer per week    Comment: pt drinks daily - 4-5 beers per day and sometimes up to 1 pint of vodka  .  Drug use: No     Current Outpatient Medications:  .  losartan-hydrochlorothiazide (HYZAAR) 50-12.5 MG tablet, Take 1 tablet by mouth daily., Disp: 90 tablet, Rfl: 3 .  hydrochlorothiazide (HYDRODIURIL) 25 MG tablet, Take 1 tablet (25 mg total) by mouth daily. (Patient not taking: Reported on 12/10/2019), Disp: 90 tablet, Rfl: 3  No Known Allergies    ROS: See pertinent positives and negatives per HPI.   EXAM:  VITALS per patient if applicable: Ht 6\' 1"  (1.854 m)   Wt 180 lb (81.6 kg)   BMI 23.75 kg/m    GENERAL: alert, oriented, in no acute distress  LUNGS: no obvious audibleSOB, gasping or wheezing, no conversational dyspnea  PSYCH/NEURO: pleasant and cooperative, speech  grossly intact   ASSESSMENT AND PLAN:  1. Essential hypertension - uncontrolled - pt never picked up Rx sent in 05/2019 and has been taking HCTZ 25mg  daily Rx: - losartan-hydrochlorothiazide (HYZAAR) 50-12.5 MG tablet; Take 1 tablet by mouth daily.  Dispense: 90 tablet; Refill: 3 - pt needs Rx sent to pharmacy closer to where he is currently living in [place], [place] but he does not know what pharmacy is close by. He will call back to office to provide pharm info so Rx can be sent - discussed importance of regular CV exercise, low sodium diet, adequate sleep - f/u in  2-3 wks or sooner PRN. Needs OV for BP check if he does not have home BP cuff  I discussed the assessment and treatment plan with the patient. The patient was provided an opportunity to ask questions and all were answered. The patient agreed with the plan and demonstrated an understanding of the instructions.   The patient was advised to call back or seek an in-person evaluation if the symptoms worsen or if the condition fails to improve as anticipated.  I personally spent 12 min with the patient today.  Luana Shu, DO

## 2020-02-22 ENCOUNTER — Ambulatory Visit: Payer: Self-pay | Admitting: Emergency Medicine

## 2020-02-22 ENCOUNTER — Other Ambulatory Visit: Payer: Self-pay

## 2020-02-22 ENCOUNTER — Encounter: Payer: Self-pay | Admitting: Emergency Medicine

## 2020-02-22 VITALS — BP 137/82 | HR 105 | Temp 98.7°F | Resp 16 | Ht 73.0 in | Wt 185.0 lb

## 2020-02-22 DIAGNOSIS — Z024 Encounter for examination for driving license: Secondary | ICD-10-CM

## 2020-02-22 DIAGNOSIS — F1010 Alcohol abuse, uncomplicated: Secondary | ICD-10-CM

## 2020-02-22 DIAGNOSIS — I10 Essential (primary) hypertension: Secondary | ICD-10-CM

## 2020-02-22 DIAGNOSIS — F10230 Alcohol dependence with withdrawal, uncomplicated: Secondary | ICD-10-CM

## 2020-02-22 DIAGNOSIS — F10930 Alcohol use, unspecified with withdrawal, uncomplicated: Secondary | ICD-10-CM

## 2020-02-22 NOTE — Progress Notes (Signed)
BP Readings from Last 3 Encounters:  02/22/20 137/82  12/10/19 (!) 182/104  05/22/19 (!) 146/77   Gary Simmons 60 y.o.   Chief Complaint  Patient presents with  . DOT PHYSICAL    HISTORY OF PRESENT ILLNESS: This is a 60 y.o. male here for DOT physical exam. Seen by me on 12/10/2019 and found to be hypertensive.  Given a 57-month certification, enough time to get his blood pressure under control.  Upon further research of his medical record additional history includes extensive history of alcohol abuse. States he only drinks beer and not when he drives.  However on presentation here today patient shows obvious signs of alcohol withdrawal. Given this presentation and history patient is presently disqualified.  HPI   Prior to Admission medications   Medication Sig Start Date End Date Taking? Authorizing Provider  APPLE CIDER VINEGAR PO Take by mouth daily.   Yes [provider]  losartan-hydrochlorothiazide (HYZAAR) 50-12.5 MG tablet Take 1 tablet by mouth daily. 01/07/20  Yes Cirigliano, Garvin Fila, DO  OVER THE COUNTER MEDICATION    Yes [provider]    No Known Allergies  Patient Active Problem List   Diagnosis Date Noted  . Essential hypertension 05/08/2019  . Alcohol abuse 07/29/2013    Past Medical History:  Diagnosis Date  . Alcohol abuse     History reviewed. No pertinent surgical history.  Social History   Socioeconomic History  . Marital status: Single    Spouse name: Not on file  . Number of children: Not on file  . Years of education: Not on file  . Highest education level: Not on file  Occupational History  . Not on file  Tobacco Use  . Smoking status: Former Research scientist (life sciences)  . Smokeless tobacco: Never Used  Substance and Sexual Activity  . Alcohol use: Yes    Alcohol/week: 28.0 standard drinks    Types: 28 Cans of beer per week    Comment: pt drinks daily - 4-5 beers per day and sometimes up to 1 pint of vodka  . Drug use: No  . Sexual  activity: Yes    Birth control/protection: None  Other Topics Concern  . Not on file  Social History Narrative  . Not on file   Social Determinants of Health   Financial Resource Strain:   . Difficulty of Paying Living Expenses: Not on file  Food Insecurity:   . Worried About Charity fundraiser in the Last Year: Not on file  . Ran Out of Food in the Last Year: Not on file  Transportation Needs:   . Lack of Transportation (Medical): Not on file  . Lack of Transportation (Non-Medical): Not on file  Physical Activity:   . Days of Exercise per Week: Not on file  . Minutes of Exercise per Session: Not on file  Stress:   . Feeling of Stress : Not on file  Social Connections:   . Frequency of Communication with Friends and Family: Not on file  . Frequency of Social Gatherings with Friends and Family: Not on file  . Attends Religious Services: Not on file  . Active Member of Clubs or Organizations: Not on file  . Attends Archivist Meetings: Not on file  . Marital Status: Not on file  Intimate Partner Violence:   . Fear of Current or Ex-Partner: Not on file  . Emotionally Abused: Not on file  . Physically Abused: Not on file  . Sexually Abused: Not on  file    Family History  Problem Relation Age of Onset  . Hypertension Father   . Alcohol abuse Father   . Cancer Mother 45       lung cancer      Review of Systems  Constitutional: Negative.  Negative for chills and fever.  HENT: Negative for congestion and sore throat.   Respiratory: Negative for cough.   Cardiovascular: Negative for chest pain and palpitations.  Gastrointestinal: Negative for abdominal pain, diarrhea, nausea and vomiting.  Neurological: Negative for dizziness and headaches.   Today's Vitals   02/22/20 1615  BP: 137/82  Pulse: (!) 105  Resp: 16  Temp: 98.7 F (37.1 C)  TempSrc: Temporal  SpO2: 97%  Weight: 185 lb (83.9 kg)  Height: 6\' 1"  (1.854 m)   Body mass index is 24.41  kg/m.   Physical Exam Vitals reviewed.  HENT:     Head: Normocephalic.  Eyes:     Extraocular Movements: Extraocular movements intact.  Cardiovascular:     Rate and Rhythm: Tachycardia present.  Pulmonary:     Effort: Pulmonary effort is normal.  Musculoskeletal:        General: Normal range of motion.     Cervical back: Normal range of motion.  Skin:    General: Skin is warm and dry.     Capillary Refill: Capillary refill takes less than 2 seconds.  Neurological:     General: No focal deficit present.     Mental Status: He is alert and oriented to person, place, and time.     Comments: Severe tremors  Psychiatric:        Mood and Affect: Mood normal.        Behavior: Behavior normal.      ASSESSMENT & PLAN: Gary Simmons was seen today for dot physical.  Diagnoses and all orders for this visit:  Encounter for Department of Transportation (DOT) examination for trucking license  Essential hypertension  Alcohol abuse  Alcohol withdrawal syndrome without complication (HCC)   Does not qualify for DOT certification.   Caryn Bee, MD [facility]

## 2020-02-22 NOTE — Patient Instructions (Signed)
° ° ° °  If you have lab work done today you will be contacted with your lab results within the next 2 weeks.  If you have not heard from us then please contact us. The fastest way to get your results is to register for My Chart. ° ° °IF you received an x-ray today, you will receive an invoice from [facility]. Please contact [facility] at [phone] with questions or concerns regarding your invoice.  ° °IF you received labwork today, you will receive an invoice from [facility]. Please contact [facility] at [phone] with questions or concerns regarding your invoice.  ° °Our billing staff will not be able to assist you with questions regarding bills from these companies. ° °You will be contacted with the lab results as soon as they are available. The fastest way to get your results is to activate your My Chart account. Instructions are located on the last page of this paperwork. If you have not heard from us regarding the results in 2 weeks, please contact this office. °  ° ° ° °

## 2020-02-28 ENCOUNTER — Other Ambulatory Visit: Payer: Self-pay | Admitting: Family Medicine

## 2020-02-28 DIAGNOSIS — I10 Essential (primary) hypertension: Secondary | ICD-10-CM

## 2020-02-29 NOTE — Telephone Encounter (Signed)
Pt takes Losartan-HCT instead/thx dmf

## 2020-03-09 ENCOUNTER — Ambulatory Visit: Payer: Self-pay | Admitting: Emergency Medicine

## 2020-03-10 ENCOUNTER — Encounter: Payer: Self-pay | Admitting: Emergency Medicine

## 2021-02-24 ENCOUNTER — Telehealth: Payer: Self-pay | Admitting: Family Medicine

## 2021-02-24 DIAGNOSIS — I10 Essential (primary) hypertension: Secondary | ICD-10-CM

## 2021-02-24 MED ORDER — LOSARTAN POTASSIUM-HCTZ 50-12.5 MG PO TABS: 1.0000 | ORAL_TABLET | Freq: Every day | ORAL | 0 refills | Status: AC

## 2021-02-24 NOTE — Telephone Encounter (Signed)
Patient needs a refill on his Losartan. If approved, please send to [facility] on [address] and call him at [phone] to let him know that it has been sent in.

## 2021-02-24 NOTE — Telephone Encounter (Signed)
lft VM to advise that a 30 day supply sent ot the pharmacy and will need to call and make an appt before he runs out.  Dm/cma

## 2021-02-28 ENCOUNTER — Other Ambulatory Visit: Payer: Self-pay | Admitting: Family Medicine

## 2021-02-28 DIAGNOSIS — I10 Essential (primary) hypertension: Secondary | ICD-10-CM

## 2021-03-01 NOTE — Telephone Encounter (Signed)
Last OV 02/22/20  Last fill 02/24/21 #30/0 duplicate
